# Patient Record
Sex: Female | Born: 1960 | Race: White | Hispanic: No | Marital: Married | Smoking: Never smoker
Health system: Southern US, Community
[De-identification: ages and names within clinical notes are randomized; demographics above are authoritative.]

## PROBLEM LIST (undated history)

## (undated) DIAGNOSIS — I509 Heart failure, unspecified: Secondary | ICD-10-CM

## (undated) DIAGNOSIS — J449 Chronic obstructive pulmonary disease, unspecified: Secondary | ICD-10-CM

## (undated) DIAGNOSIS — R609 Edema, unspecified: Secondary | ICD-10-CM

## (undated) DIAGNOSIS — J45909 Unspecified asthma, uncomplicated: Secondary | ICD-10-CM

## (undated) HISTORY — PX: TYMPANOSTOMY TUBE PLACEMENT: SHX32

## (undated) HISTORY — PX: CARPAL TUNNEL RELEASE: SHX101

## (undated) HISTORY — PX: FOOT SURGERY: SHX648

## (undated) HISTORY — DX: Heart failure, unspecified: I50.9

## (undated) HISTORY — DX: Edema, unspecified: R60.9

## (undated) HISTORY — DX: Chronic obstructive pulmonary disease, unspecified: J44.9

## (undated) HISTORY — PX: SHOULDER SURGERY: SHX246

## (undated) HISTORY — DX: Unspecified asthma, uncomplicated: J45.909

---

## 2007-09-20 ENCOUNTER — Ambulatory Visit: Payer: Self-pay

## 2010-10-02 ENCOUNTER — Emergency Department: Payer: Self-pay | Admitting: *Deleted

## 2011-06-30 DIAGNOSIS — M25559 Pain in unspecified hip: Secondary | ICD-10-CM | POA: Insufficient documentation

## 2013-03-20 IMAGING — CR DG ELBOW COMPLETE 3+V*L*
1 series · 4 of 4 positions shown · non-contrast
Comparison: none

REASON FOR EXAM: injury
COMMENTS:   May transport without cardiac monitor

PROCEDURE:     DXR - DXR ELBOW LT COMP W/OBLIQUES  - October 02, 2010  [DATE]
RESULT:     Comparison:  None

[Series 1: view not recorded · 0.17mm/px · 4 of 4 slices shown]
[im 1/4]
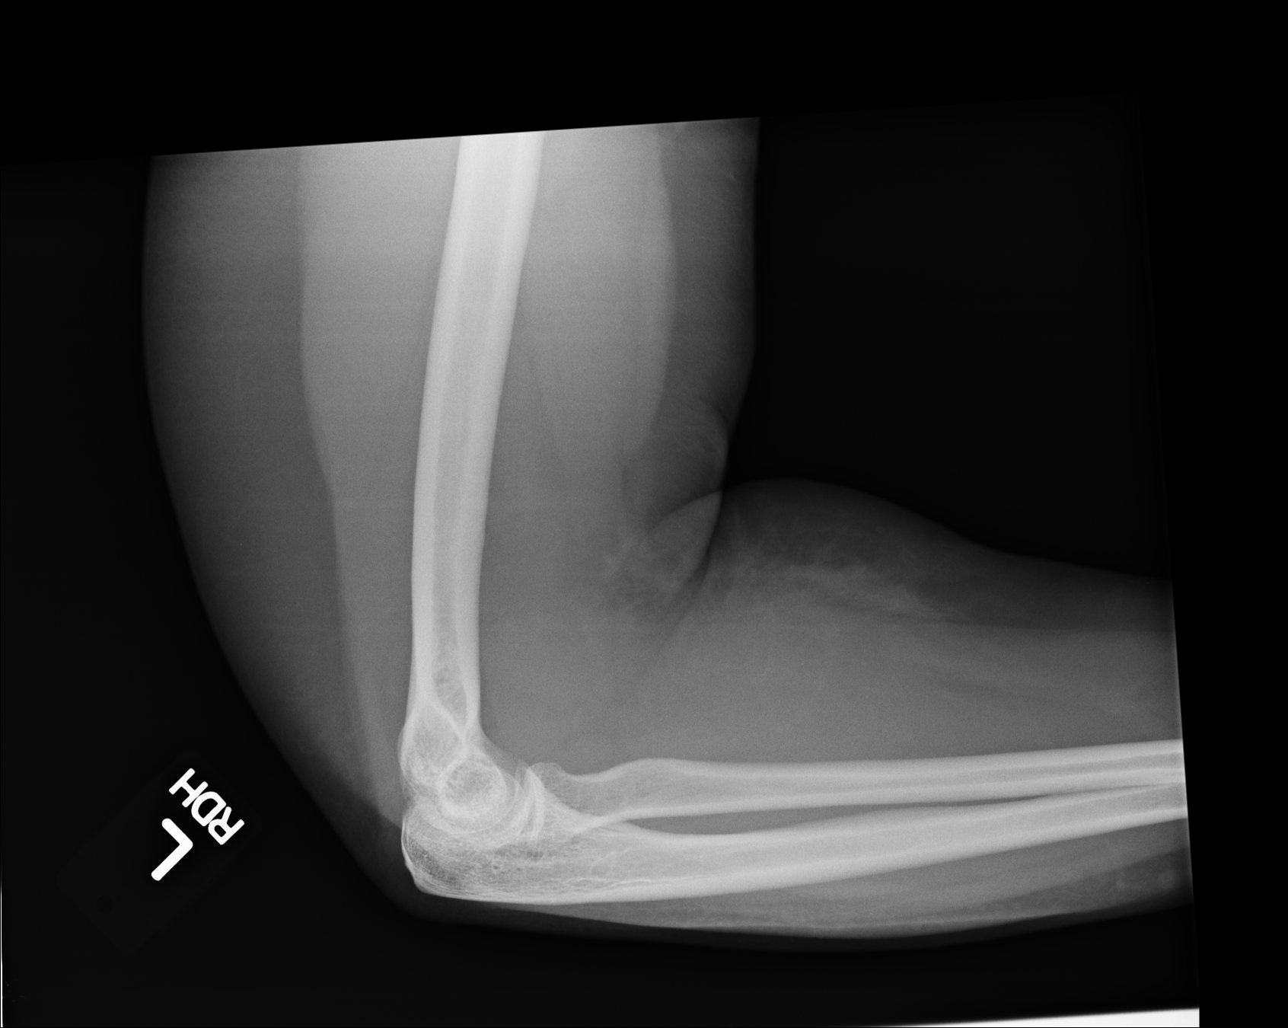
[im 2/4]
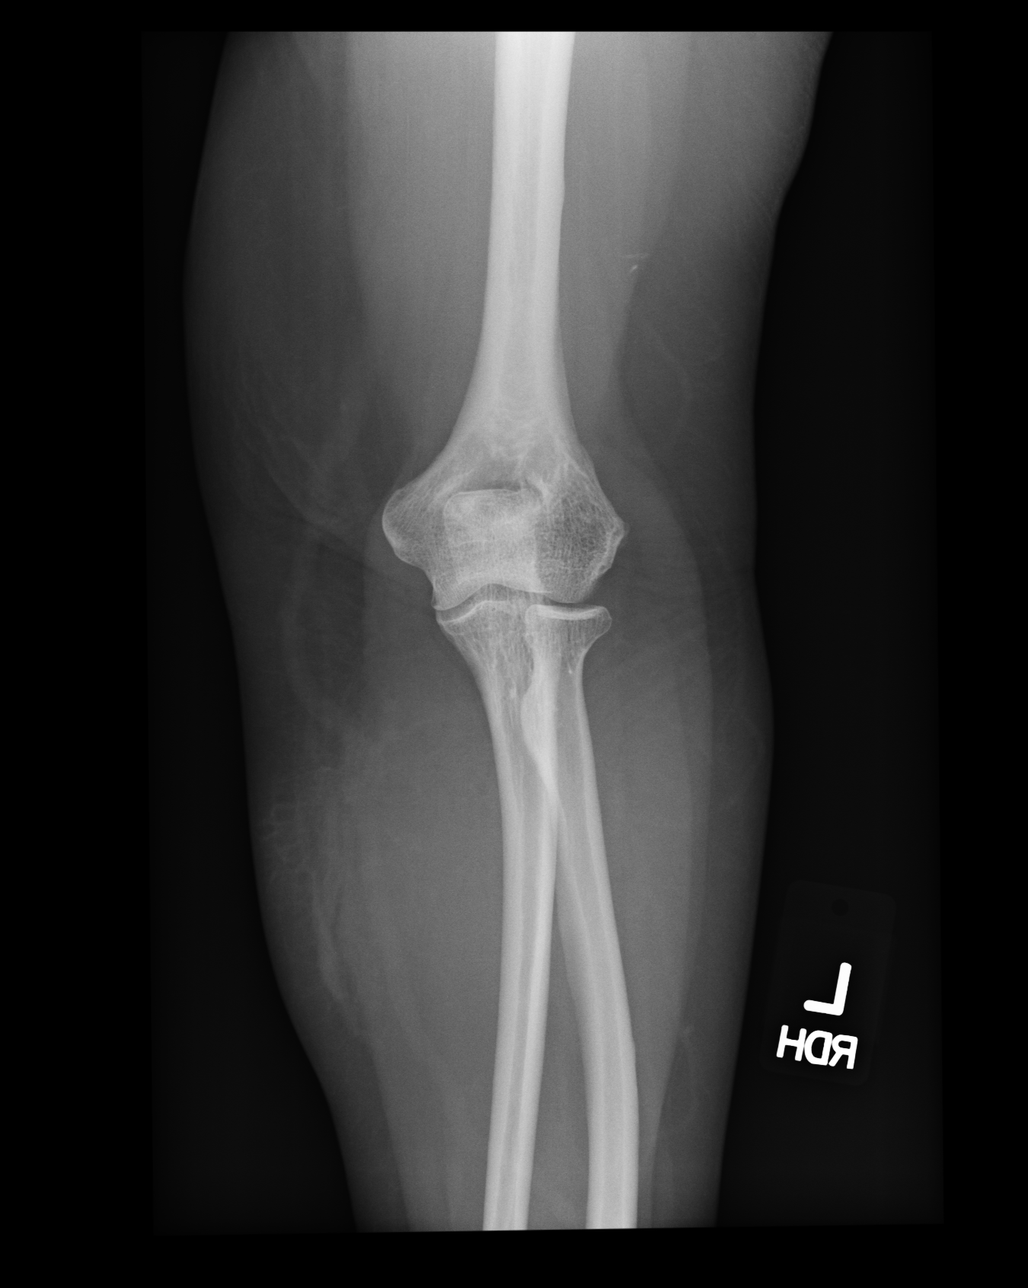
[im 3/4]
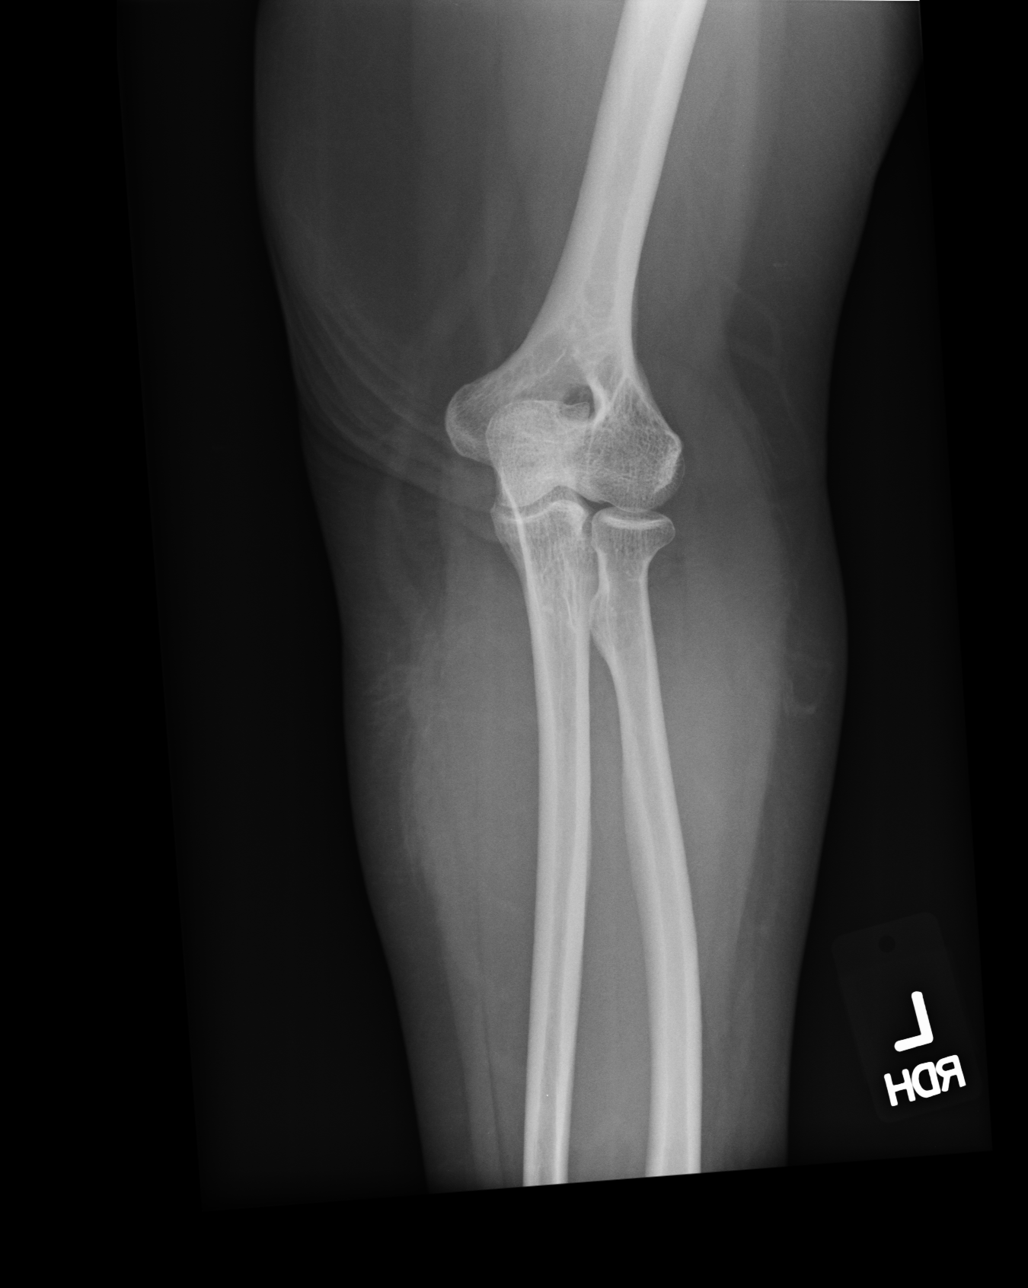
[im 4/4]
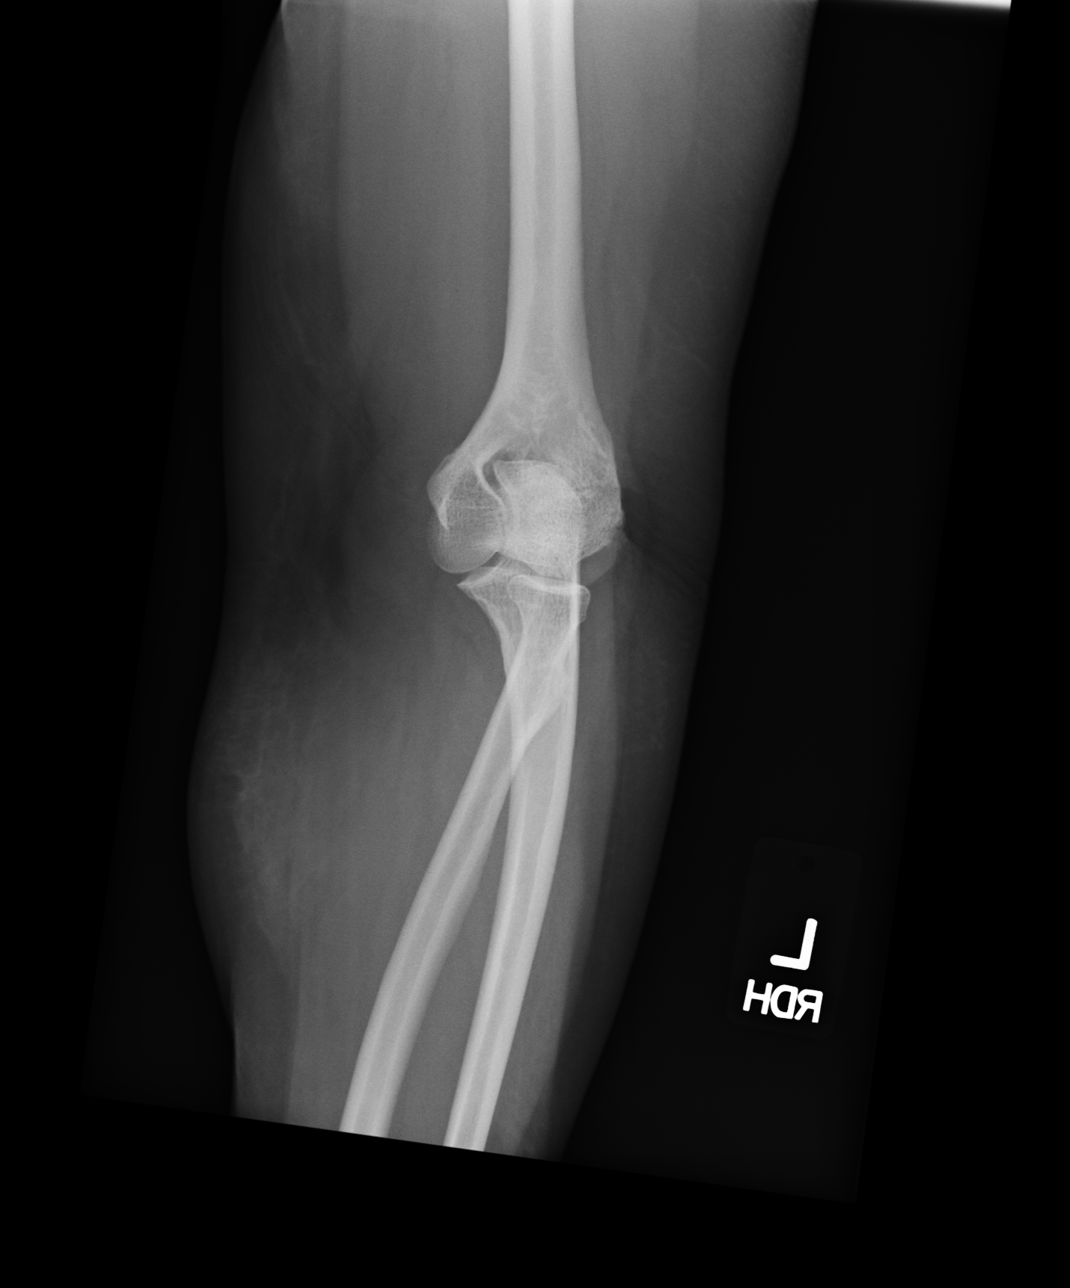

[4 of 4 positions shown; findings below may reference images not displayed]

FINDINGS: 4 views of the left elbow demonstrates no fracture or dislocation. There is
no significant joint effusion. The soft tissues are normal.
IMPRESSION: No acute osseous injury of the left elbow.

## 2013-07-28 LAB — HM MAMMOGRAPHY: HM Mammogram: NORMAL

## 2013-07-28 LAB — HM PAP SMEAR

## 2013-09-24 ENCOUNTER — Telehealth: Payer: Self-pay

## 2013-09-24 NOTE — Telephone Encounter (Signed)
Called spoke with Tiffany from [facility]. 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 A fax will be coming over with 2 questions they need answered if pt can be seen by us. Other doctors have been in her care are Dr. Patrice ParadiseSharma Rajan, Dr. Vista Minkobert Ross, Dr. Bethel BornBardless. [facility] has 15 pages of medcial records on pt. Please advise MW thanks

## 2013-09-24 NOTE — Telephone Encounter (Signed)
lmomtcb x1 for tiffany with [facility].

## 2013-09-24 NOTE — Telephone Encounter (Signed)
I don't see where I've seen this pt and do not agree to review paperwork first - I do see self referrals

## 2013-09-25 NOTE — Telephone Encounter (Signed)
lmomtcb x2 for [employer] Soupiffany

## 2013-09-25 NOTE — Telephone Encounter (Addendum)
Spoke with Tiffany from [facility] Calling back to try and figure out if we are able to see the patient--pt would be new to our office(Consult appt) Elmarie Shileyiffany states that if we agree to the see the patient then a Medical Reimbursement Form would be faxed over for the Dr Sherene SiresWert to sign. This form consists of an agreement that ONLY $450.00 will be reimbursed to our office for any and all services provided by our office the day of the appointment. Also, states that they will need a detailed breakdown of the costs expected for the upcoming appt in order to move forward. I advised Tiffany that there is no way to be 100% sure of what the appt will cost before the patient is seen. Tiffany stated that before we could schedule the patient, she would have to have this agreement signed and returned--states that ONLY $450 will be reimbursed and anything thereafter will not be reimbursed to our office.  Per Tiffany, if agreed, forms will be faxed to our office for Dr Sherene SiresWert to review prior to OV. Tiffany requesting to know if this can be agreed upon and if the patient can be scheduled.  Please advise Dr Sherene SiresWert if you are willing to see the patient with these terms. Thanks.

## 2013-09-25 NOTE — Telephone Encounter (Signed)
No, please decline

## 2013-09-25 NOTE — Telephone Encounter (Signed)
Called, spoke with Tiffany with [facility].  She is now aware MW declines.  She verbalized understanding.

## 2013-10-09 ENCOUNTER — Encounter: Payer: Self-pay | Admitting: Cardiovascular Disease

## 2013-10-09 ENCOUNTER — Ambulatory Visit (INDEPENDENT_AMBULATORY_CARE_PROVIDER_SITE_OTHER): Payer: BC Managed Care – PPO | Admitting: Cardiovascular Disease

## 2013-10-09 VITALS — BP 128/70 | HR 78 | Ht 60.0 in | Wt 225.0 lb

## 2013-10-09 DIAGNOSIS — I509 Heart failure, unspecified: Secondary | ICD-10-CM

## 2013-10-09 DIAGNOSIS — I5032 Chronic diastolic (congestive) heart failure: Secondary | ICD-10-CM | POA: Insufficient documentation

## 2013-10-09 DIAGNOSIS — R609 Edema, unspecified: Secondary | ICD-10-CM

## 2013-10-09 DIAGNOSIS — R0602 Shortness of breath: Secondary | ICD-10-CM | POA: Insufficient documentation

## 2013-10-09 DIAGNOSIS — R600 Localized edema: Secondary | ICD-10-CM

## 2013-10-09 DIAGNOSIS — E785 Hyperlipidemia, unspecified: Secondary | ICD-10-CM | POA: Insufficient documentation

## 2013-10-09 MED ORDER — FUROSEMIDE 20 MG PO TABS: 20.0000 mg | ORAL_TABLET | Freq: Two times a day (BID) | ORAL | Status: DC | PRN

## 2013-10-09 MED ORDER — POTASSIUM CHLORIDE ER 10 MEQ PO TBCR: 10.0000 meq | EXTENDED_RELEASE_TABLET | Freq: Two times a day (BID) | ORAL | Status: DC | PRN

## 2013-10-09 NOTE — Assessment & Plan Note (Signed)
We will draw her lipids today. She reports running high recently. Given her very strong family history, we would be aggressive with her cholesterol

## 2013-10-09 NOTE — Assessment & Plan Note (Signed)
Etiology of her shortness of breath is unclear. Unable to exclude diastolic CHF. We have offered her Lasix to take as needed with potassium. She'll take this when her leg swelling gets severe. Echocardiogram has been ordered to evaluate her cardiac function, right heart pressures.  Unable to exclude deconditioning and morbid obesity as a contributor to her symptoms. Recommended regular walking program, weight loss

## 2013-10-09 NOTE — Assessment & Plan Note (Signed)
We have encouraged continued exercise, careful diet management in an effort to lose weight. 

## 2013-10-09 NOTE — Assessment & Plan Note (Signed)
Recommended she take Lasix for weight gain, try to avoid excessive fluid intake and restrict her salt
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
## 2013-10-09 NOTE — Patient Instructions (Addendum)
You are doing well.  Please take lasix as needed for significant leg swelling Take with potassium  We will check your labs today  We will order an echocardiogram for murmur, leg swelling, shortness of breath  Please call us if you have new issues that need to be addressed before your next appt.

## 2013-10-09 NOTE — Assessment & Plan Note (Signed)
Likely has  chronic diastolic CHF also with venous insufficiency exacerbated by her weight. We'll start Lasix when necessary for episodes of severe leg edema, echo pending.

## 2013-10-10 LAB — LIPID PANEL
CHOL/HDL RATIO: 4.1 ratio (ref 0.0–4.4)
Cholesterol, Total: 211 mg/dL — ABNORMAL HIGH (ref 100–199)
HDL: 51 mg/dL (ref >39)
LDL CALC: 139 mg/dL — AB (ref 0–99)
Triglycerides: 106 mg/dL (ref 0–149)
VLDL Cholesterol Cal: 21 mg/dL (ref 5–40)

## 2013-10-15 ENCOUNTER — Other Ambulatory Visit (INDEPENDENT_AMBULATORY_CARE_PROVIDER_SITE_OTHER): Payer: BC Managed Care – PPO

## 2013-10-15 ENCOUNTER — Other Ambulatory Visit: Payer: Self-pay

## 2013-10-15 DIAGNOSIS — E785 Hyperlipidemia, unspecified: Secondary | ICD-10-CM

## 2013-10-15 DIAGNOSIS — R0602 Shortness of breath: Secondary | ICD-10-CM

## 2013-10-15 DIAGNOSIS — R609 Edema, unspecified: Secondary | ICD-10-CM

## 2013-10-15 DIAGNOSIS — R600 Localized edema: Secondary | ICD-10-CM

## 2013-10-15 MED ORDER — LOVASTATIN 20 MG PO TABS: 20.0000 mg | ORAL_TABLET | Freq: Every day | ORAL | Status: DC

## 2013-11-06 ENCOUNTER — Telehealth: Payer: Self-pay | Admitting: *Deleted

## 2013-11-06 NOTE — Telephone Encounter (Signed)
Reviewed potassium and lasix instructions with patient  Patient verbalized understanding

## 2013-11-06 NOTE — Telephone Encounter (Signed)
Patient called and has some questions regarding postassium.

## 2013-11-28 ENCOUNTER — Ambulatory Visit (INDEPENDENT_AMBULATORY_CARE_PROVIDER_SITE_OTHER): Payer: BC Managed Care – PPO | Admitting: Internal Medicine

## 2013-11-28 ENCOUNTER — Encounter: Payer: Self-pay | Admitting: Internal Medicine

## 2013-11-28 VITALS — BP 120/70 | HR 75 | Temp 98.2°F | Ht 60.0 in | Wt 223.5 lb

## 2013-11-28 DIAGNOSIS — E785 Hyperlipidemia, unspecified: Secondary | ICD-10-CM

## 2013-11-28 DIAGNOSIS — R0683 Snoring: Secondary | ICD-10-CM

## 2013-11-28 DIAGNOSIS — I5032 Chronic diastolic (congestive) heart failure: Secondary | ICD-10-CM

## 2013-11-28 DIAGNOSIS — Z1211 Encounter for screening for malignant neoplasm of colon: Secondary | ICD-10-CM

## 2013-11-28 DIAGNOSIS — I509 Heart failure, unspecified: Secondary | ICD-10-CM

## 2013-11-28 DIAGNOSIS — J45909 Unspecified asthma, uncomplicated: Secondary | ICD-10-CM

## 2013-11-28 DIAGNOSIS — J4520 Mild intermittent asthma, uncomplicated: Secondary | ICD-10-CM

## 2013-11-28 DIAGNOSIS — R0989 Other specified symptoms and signs involving the circulatory and respiratory systems: Secondary | ICD-10-CM

## 2013-11-28 DIAGNOSIS — R0609 Other forms of dyspnea: Secondary | ICD-10-CM

## 2013-11-28 LAB — MICROALBUMIN / CREATININE URINE RATIO
Creatinine,U: 159.9 mg/dL
MICROALB UR: 0.2 mg/dL (ref 0.0–1.9)
Microalb Creat Ratio: 0.1 mg/g (ref 0.0–30.0)

## 2013-11-28 LAB — COMPREHENSIVE METABOLIC PANEL
ALBUMIN: 4 g/dL (ref 3.5–5.2)
ALK PHOS: 84 U/L (ref 39–117)
ALT: 17 U/L (ref 0–35)
AST: 18 U/L (ref 0–37)
BILIRUBIN TOTAL: 1 mg/dL (ref 0.2–1.2)
BUN: 15 mg/dL (ref 6–23)
CO2: 27 mEq/L (ref 19–32)
Calcium: 9.1 mg/dL (ref 8.4–10.5)
Chloride: 104 mEq/L (ref 96–112)
Creatinine, Ser: 1 mg/dL (ref 0.4–1.2)
GFR: 64.83 mL/min (ref >60.00)
GLUCOSE: 105 mg/dL — AB (ref 70–99)
POTASSIUM: 4.4 meq/L (ref 3.5–5.1)
SODIUM: 138 meq/L (ref 135–145)
TOTAL PROTEIN: 6.7 g/dL (ref 6.0–8.3)

## 2013-11-28 LAB — HEMOGLOBIN A1C: HEMOGLOBIN A1C: 5.2 % (ref 4.6–6.5)

## 2013-11-28 LAB — LIPID PANEL
CHOL/HDL RATIO: 3
Cholesterol: 157 mg/dL (ref 0–200)
HDL: 48.2 mg/dL (ref >39.00)
LDL Cholesterol: 94 mg/dL (ref 0–99)
NONHDL: 108.8
Triglycerides: 76 mg/dL (ref 0.0–149.0)
VLDL: 15.2 mg/dL (ref 0.0–40.0)

## 2013-11-28 LAB — TSH: TSH: 2.05 u[IU]/mL (ref 0.35–4.50)

## 2013-11-28 NOTE — Patient Instructions (Signed)
Labs today.  We will set up a sleep study and colonoscopy.  Follow up in 4 weeks.

## 2013-11-28 NOTE — Progress Notes (Signed)
Subjective:    Patient ID: Brianna Nunez    DOB: 1960/04/02, 53 y.o.   MRN: [id number]  HPI 53YO female presents to establish care.  Seen by pulmonary specialist yesterday. PFTs performed. Reports that asthma generally well controlled. Flares with exposure to perfumes.  UTD on PAP which she reports was normal in 07/2013. Mammogram also reportedly normal.  Notes snoring at night for several years. Occasional gasping for breath. Has never had a sleep study.  Would like to lose weight. Not following any diet or exercise program.  Review of Systems  Constitutional: Negative for fever, chills, appetite change, fatigue and unexpected weight change.  Eyes: Negative for visual disturbance.  Respiratory: Negative for cough, shortness of breath and wheezing.   Cardiovascular: Negative for chest pain and leg swelling.  Gastrointestinal: Negative for nausea, vomiting, abdominal pain, diarrhea and constipation.  Musculoskeletal: Positive for arthralgias and myalgias.  Skin: Negative for color change and rash.  Hematological: Negative for adenopathy. Does not bruise/bleed easily.  Psychiatric/Behavioral: Positive for sleep disturbance (snoring). Negative for dysphoric mood. The patient is not nervous/anxious.        Objective:    BP 120/70  Pulse 75  Temp(Src) 98.2 F (36.8 C) (Oral)  Ht 5' (1.524 m)  Wt 223 lb 8 oz (101.379 kg)  BMI 43.65 kg/m2  SpO2 97% Physical Exam  Constitutional: She is oriented to person, place, and time. She appears well-developed and well-nourished. No distress.  HENT:  Head: Normocephalic and atraumatic.  Right Ear: External ear normal.  Left Ear: External ear normal.  Nose: Nose normal.  Mouth/Throat: Oropharynx is clear and moist. No oropharyngeal exudate.  Eyes: Conjunctivae and EOM are normal. Pupils are equal, round, and reactive to light. Right eye exhibits no discharge. Left eye exhibits no discharge. No scleral icterus.  Neck: Normal  range of motion. Neck supple. No tracheal deviation present. No thyromegaly present.  Cardiovascular: Normal rate, regular rhythm, normal heart sounds and intact distal pulses.  Exam reveals no gallop and no friction rub.   No murmur heard. Pulmonary/Chest: Effort normal and breath sounds normal. No accessory muscle usage. Not tachypneic. No respiratory distress. She has no decreased breath sounds. She has no wheezes. She has no rhonchi. She has no rales. She exhibits no tenderness.  Abdominal: Soft. Bowel sounds are normal. She exhibits no distension and no mass. There is no tenderness. There is no rebound and no guarding.  Musculoskeletal: Normal range of motion. She exhibits no edema and no tenderness.  Lymphadenopathy:    She has no cervical adenopathy.  Neurological: She is alert and oriented to person, place, and time. No cranial nerve deficit. She exhibits normal muscle tone. Coordination normal.  Skin: Skin is warm and dry. No rash noted. She is not diaphoretic. No erythema. No pallor.  Psychiatric: She has a normal mood and affect. Her behavior is normal. Judgment and thought content normal.          Assessment & Plan:   Problem List Items Addressed This Visit     Unprioritized   Asthma, mild intermittent, well-controlled     Will request recent notes from pulmonologist.     Chronic diastolic CHF (congestive heart failure) - Primary     Reviewed notes from Dr. Mariah Milling. Symptomatically doing well. Appears euvolemic. Will check renal function with labs. Continue current medications.    Relevant Orders      Comprehensive metabolic panel      Microalbumin / creatinine urine ratio  Hyperlipidemia     Will recheck lipids and LFTs with labs today. Continue Lovastatin.    Relevant Orders      Lipid panel   Morbid obesity      Wt Readings from Last 3 Encounters:  11/28/13 223 lb 8 oz (101.379 kg)  10/09/13 225 lb (102.059 kg)   Body mass index is 43.65 kg/(m^2). Encouraged  healthy diet and exercise for weight loss. Will set up nutrition referral.    Relevant Orders      Hemoglobin A1c      TSH      Amb ref to Medical Nutrition Therapy-MNT   Screening for colon cancer   Relevant Orders      Ambulatory referral to Gastroenterology   Snoring     Given obesity and snoring, high risk OSA. Will set up sleep study.    Relevant Orders      Ambulatory referral to Sleep Studies       Return in about 4 weeks (around 12/26/2013).

## 2013-11-28 NOTE — Assessment & Plan Note (Signed)
Wt Readings from Last 3 Encounters:  11/28/13 223 lb 8 oz (101.379 kg)  10/09/13 225 lb (102.059 kg)   Body mass index is 43.65 kg/(m^2). Encouraged healthy diet and exercise for weight loss. Will set up nutrition referral.

## 2013-11-28 NOTE — Assessment & Plan Note (Signed)
Will request recent notes from pulmonologist.

## 2013-11-28 NOTE — Assessment & Plan Note (Signed)
Given obesity and snoring, high risk OSA. Will set up sleep study.

## 2013-11-28 NOTE — Progress Notes (Signed)
Pre visit review using our clinic review tool, if applicable. No additional management support is needed unless otherwise documented below in the visit note. 

## 2013-11-28 NOTE — Assessment & Plan Note (Signed)
Reviewed notes from Dr. Mariah Milling. Symptomatically doing well. Appears euvolemic. Will check renal function with labs. Continue current medications.

## 2013-11-28 NOTE — Assessment & Plan Note (Signed)
Will recheck lipids and LFTs with labs today. Continue Lovastatin.

## 2013-11-29 ENCOUNTER — Telehealth: Payer: Self-pay

## 2013-11-29 ENCOUNTER — Encounter: Payer: Self-pay | Admitting: *Deleted

## 2013-11-29 NOTE — Telephone Encounter (Signed)
Pt states she went to her PCP, told her the u/s that was done here said she had CHF. Please call.

## 2013-11-29 NOTE — Telephone Encounter (Signed)
Spoke w/ pt.  She is confused, as she states that she was not aware that she had the diagnosis of CHF.  Had lengthy discussion w/ pt regarding her last ov, her diagnoses, her diet, exercise habits and ways to help.  She is very appreciative and asks that I mail out info on her diagnosis.  Advised pt that I am mailing info and asked her to call back w/ any questions.

## 2013-12-26 ENCOUNTER — Ambulatory Visit (INDEPENDENT_AMBULATORY_CARE_PROVIDER_SITE_OTHER): Payer: BC Managed Care – PPO | Admitting: Internal Medicine

## 2013-12-26 ENCOUNTER — Encounter: Payer: Self-pay | Admitting: Internal Medicine

## 2013-12-26 DIAGNOSIS — R0683 Snoring: Secondary | ICD-10-CM

## 2013-12-26 DIAGNOSIS — Z23 Encounter for immunization: Secondary | ICD-10-CM

## 2013-12-26 DIAGNOSIS — E785 Hyperlipidemia, unspecified: Secondary | ICD-10-CM

## 2013-12-26 DIAGNOSIS — E559 Vitamin D deficiency, unspecified: Secondary | ICD-10-CM

## 2013-12-26 DIAGNOSIS — R600 Localized edema: Secondary | ICD-10-CM

## 2013-12-26 MED ORDER — LOVASTATIN 20 MG PO TABS: 20.0000 mg | ORAL_TABLET | Freq: Every day | ORAL | Status: DC

## 2013-12-26 NOTE — Assessment & Plan Note (Signed)
Will recheck Vit D with labs today. 

## 2013-12-26 NOTE — Patient Instructions (Signed)
Continue current medications.  Follow up for sleep study when scheduled.

## 2013-12-26 NOTE — Assessment & Plan Note (Signed)
Edema improved with prn Furosemide. Will continue.

## 2013-12-26 NOTE — Progress Notes (Signed)
Subjective:    Patient ID: Brianna GentaBarbara A Benavidez, female    DOB: 07/18/1960, 53 y.o.   MRN: [id number]  HPI 53YO female presents for follow up.  Obesity - has lost 4lbs. Scheduled to meet with nutritionist. Has not yet started any specific program of exercise or diet.  Last A1c was <6%. Trying to limit sugared soda intake.  HL - Recent LDL was 94. Compliant with medication.  Snoring - Sleep study is pending.  Review of Systems  Constitutional: Negative for fever, chills, appetite change, fatigue and unexpected weight change.  Eyes: Negative for visual disturbance.  Respiratory: Positive for shortness of breath (with exertion).   Cardiovascular: Negative for chest pain and leg swelling.  Gastrointestinal: Negative for vomiting, abdominal pain, diarrhea and constipation.  Skin: Negative for color change and rash.  Hematological: Negative for adenopathy. Does not bruise/bleed easily.  Psychiatric/Behavioral: Negative for dysphoric mood. The patient is not nervous/anxious.        Objective:    BP 108/60  Pulse 82  Temp(Src) 98.3 F (36.8 C) (Oral)  Ht 5' (1.524 m)  Wt 219 lb 8 oz (99.565 kg)  BMI 42.87 kg/m2  SpO2 97% Physical Exam  Constitutional: She is oriented to person, place, and time. She appears well-developed and well-nourished. No distress.  HENT:  Head: Normocephalic and atraumatic.  Right Ear: External ear normal.  Left Ear: External ear normal.  Nose: Nose normal.  Mouth/Throat: Oropharynx is clear and moist. No oropharyngeal exudate.  Eyes: Conjunctivae are normal. Pupils are equal, round, and reactive to light. Right eye exhibits no discharge. Left eye exhibits no discharge. No scleral icterus.  Neck: Normal range of motion. Neck supple. No tracheal deviation present. No thyromegaly present.  Cardiovascular: Normal rate, regular rhythm, normal heart sounds and intact distal pulses.  Exam reveals no gallop and no friction rub.   No murmur  heard. Pulmonary/Chest: Effort normal and breath sounds normal. No accessory muscle usage. Not tachypneic. No respiratory distress. She has no decreased breath sounds. She has no wheezes. She has no rhonchi. She has no rales. She exhibits no tenderness.  Musculoskeletal: Normal range of motion. She exhibits no edema and no tenderness.  Lymphadenopathy:    She has no cervical adenopathy.  Neurological: She is alert and oriented to person, place, and time. No cranial nerve deficit. She exhibits normal muscle tone. Coordination normal.  Skin: Skin is warm and dry. No rash noted. She is not diaphoretic. No erythema. No pallor.  Psychiatric: She has a normal mood and affect. Her behavior is normal. Judgment and thought content normal.          Assessment & Plan:   Problem List Items Addressed This Visit     Unprioritized   Bilateral leg edema     Edema improved with prn Furosemide. Will continue.    Hyperlipidemia     Lipids markedly improved on Lovastatin. Will continue.    Relevant Medications      lovastatin (MEVACOR) tablet   Morbid obesity - Primary      Wt Readings from Last 3 Encounters:  12/26/13 219 lb 8 oz (99.565 kg)  11/28/13 223 lb 8 oz (101.379 kg)  10/09/13 225 lb (102.059 kg)   Body mass index is 42.87 kg/(m^2). Encouraged continued healthy diet and exercise with goal of weight loss. Will meet with nutritionist next month.    Snoring     Sleep study pending.    Vitamin D deficiency     Will recheck  Vit D with labs today.    Relevant Orders      Vitamin D (25 hydroxy)    Other Visit Diagnoses   Need for influenza vaccination        Relevant Orders       Flu Vaccine QUAD 36+ mos PF IM (Fluarix Quad PF) (Completed)        Return in about 3 months (around 03/28/2014) for Recheck of Diabetes.

## 2013-12-26 NOTE — Assessment & Plan Note (Signed)
Wt Readings from Last 3 Encounters:  12/26/13 219 lb 8 oz (99.565 kg)  11/28/13 223 lb 8 oz (101.379 kg)  10/09/13 225 lb (102.059 kg)   Body mass index is 42.87 kg/(m^2). Encouraged continued healthy diet and exercise with goal of weight loss. Will meet with nutritionist next month.

## 2013-12-26 NOTE — Progress Notes (Signed)
Pre visit review using our clinic review tool, if applicable. No additional management support is needed unless otherwise documented below in the visit note. 

## 2013-12-26 NOTE — Assessment & Plan Note (Signed)
Sleep study pending 

## 2013-12-26 NOTE — Assessment & Plan Note (Signed)
Lipids markedly improved on Lovastatin. Will continue.

## 2013-12-27 LAB — VITAMIN D 25 HYDROXY (VIT D DEFICIENCY, FRACTURES): VITD: 40.52 ng/mL (ref 30.00–100.00)

## 2013-12-30 ENCOUNTER — Encounter: Payer: Self-pay | Admitting: *Deleted

## 2013-12-31 ENCOUNTER — Ambulatory Visit: Payer: Self-pay | Admitting: Internal Medicine

## 2014-01-05 ENCOUNTER — Ambulatory Visit: Payer: Self-pay | Admitting: Internal Medicine

## 2014-01-13 ENCOUNTER — Telehealth: Payer: Self-pay | Admitting: Internal Medicine

## 2014-01-13 NOTE — Telephone Encounter (Signed)
Yes this has been completed

## 2014-01-13 NOTE — Telephone Encounter (Signed)
Sleep study showed sleep apnea, and we need to set up CPAP titration. Has this been set up?

## 2014-01-24 ENCOUNTER — Telehealth: Payer: Self-pay | Admitting: *Deleted

## 2014-01-24 NOTE — Telephone Encounter (Signed)
I have not seen the CPAP titration study results. We will need to request these.

## 2014-01-24 NOTE — Telephone Encounter (Signed)
Pt called wanting to know results of sleep study, Pt states that she received a call from [facility] about picking up her CPAP machine.  Pt wants to know what her results showed

## 2014-01-27 NOTE — Telephone Encounter (Signed)
OK. Will need CPAP settings from test to place order for CPAP at home.

## 2014-01-27 NOTE — Telephone Encounter (Signed)
Spoke with Brianna Nunez at [facility] and he states that pt's AHI = 20 .  His system is down right now but he will send complete copy of results to us once it gets back up.

## 2014-02-06 ENCOUNTER — Encounter: Payer: Self-pay | Admitting: Internal Medicine

## 2014-02-10 ENCOUNTER — Ambulatory Visit: Payer: Self-pay | Admitting: Gastroenterology

## 2014-03-03 LAB — HM COLONOSCOPY

## 2014-03-28 ENCOUNTER — Ambulatory Visit: Payer: BC Managed Care – PPO | Admitting: Internal Medicine

## 2014-04-01 ENCOUNTER — Encounter: Payer: Self-pay | Admitting: Internal Medicine

## 2014-04-03 ENCOUNTER — Ambulatory Visit (INDEPENDENT_AMBULATORY_CARE_PROVIDER_SITE_OTHER): Payer: BLUE CROSS/BLUE SHIELD | Admitting: Internal Medicine

## 2014-04-03 ENCOUNTER — Encounter: Payer: Self-pay | Admitting: Internal Medicine

## 2014-04-03 VITALS — BP 109/75 | HR 78 | Temp 97.9°F | Ht 60.0 in | Wt 219.5 lb

## 2014-04-03 DIAGNOSIS — R300 Dysuria: Secondary | ICD-10-CM

## 2014-04-03 DIAGNOSIS — G4733 Obstructive sleep apnea (adult) (pediatric): Secondary | ICD-10-CM

## 2014-04-03 LAB — POCT URINALYSIS DIPSTICK
Bilirubin, UA: NEGATIVE
GLUCOSE UA: NEGATIVE
KETONES UA: NEGATIVE
Nitrite, UA: NEGATIVE
PH UA: 5
Protein, UA: NEGATIVE
RBC UA: NEGATIVE
Spec Grav, UA: 1.02
Urobilinogen, UA: 0.2

## 2014-04-03 NOTE — Assessment & Plan Note (Signed)
Encouraged compliance with healthy diet and exercise.  Wt Readings from Last 3 Encounters:  04/03/14 219 lb 8 oz (99.565 kg)  12/26/13 219 lb 8 oz (99.565 kg)  11/28/13 223 lb 8 oz (101.379 kg)

## 2014-04-03 NOTE — Assessment & Plan Note (Signed)
Some intolerance to CPAP mask. Will continue to adjust for comfort and tolerance.

## 2014-04-03 NOTE — Patient Instructions (Signed)
Follow up in 6 months 

## 2014-04-03 NOTE — Addendum Note (Signed)
Addended by: Montine CircleMALDONADO, Taronda Comacho D on: 04/03/2014 02:11 PM   Modules accepted: Orders

## 2014-04-03 NOTE — Assessment & Plan Note (Addendum)
Recent mild dysuria. UA pos for leuk only. Will send for culture. Encouraged increased fluid intake. Monitor symptoms for now and hold on antibiotic.

## 2014-04-03 NOTE — Progress Notes (Signed)
Subjective:    Patient ID: Brianna GentaBarbara A Nunez, female    DOB: 1960-10-20, 54 y.o.   MRN: [id number]  HPI  54YO female presents for follow up.  Having trouble using CPAP after recent episode of bronchitis. [facility] Uses nasal mask, but changed to full mask. Tolerating this better.   Recently notes some burning with urination. No urinary frequency or urgency. No hematuria. No flank pain. Not taking anything for this.  Wt Readings from Last 3 Encounters:  04/03/14 219 lb 8 oz (99.565 kg)  12/26/13 219 lb 8 oz (99.565 kg)  11/28/13 223 lb 8 oz (101.379 kg)     Past medical, surgical, family and social history per today's encounter.  Review of Systems  Constitutional: Negative for fever, chills, appetite change, fatigue and unexpected weight change.  Eyes: Negative for visual disturbance.  Respiratory: Negative for shortness of breath.   Cardiovascular: Negative for chest pain and leg swelling.  Gastrointestinal: Negative for abdominal pain, diarrhea and constipation.  Genitourinary: Positive for dysuria. Negative for urgency, frequency, hematuria and flank pain.  Skin: Negative for color change and rash.  Hematological: Negative for adenopathy. Does not bruise/bleed easily.  Psychiatric/Behavioral: Positive for sleep disturbance. Negative for dysphoric mood. The patient is not nervous/anxious.        Objective:    BP 109/75 mmHg  Pulse 78  Temp(Src) 97.9 F (36.6 C) (Oral)  Ht 5' (1.524 m)  Wt 219 lb 8 oz (99.565 kg)  BMI 42.87 kg/m2  SpO2 93% Physical Exam  Constitutional: She is oriented to person, place, and time. She appears well-developed and well-nourished. No distress.  HENT:  Head: Normocephalic and atraumatic.  Right Ear: External ear normal.  Left Ear: External ear normal.  Nose: Nose normal.  Mouth/Throat: Oropharynx is clear and moist. No oropharyngeal exudate.  Eyes: Conjunctivae are normal. Pupils are equal, round, and  reactive to light. Right eye exhibits no discharge. Left eye exhibits no discharge. No scleral icterus.  Neck: Normal range of motion. Neck supple. No tracheal deviation present. No thyromegaly present.  Cardiovascular: Normal rate, regular rhythm, normal heart sounds and intact distal pulses.  Exam reveals no gallop and no friction rub.   No murmur heard. Pulmonary/Chest: Effort normal and breath sounds normal. No accessory muscle usage. No tachypnea. No respiratory distress. She has no decreased breath sounds. She has no wheezes. She has no rhonchi. She has no rales. She exhibits no tenderness.  Abdominal: There is no tenderness (no CVA tenderness).  Musculoskeletal: Normal range of motion. She exhibits no edema or tenderness.  Lymphadenopathy:    She has no cervical adenopathy.  Neurological: She is alert and oriented to person, place, and time. No cranial nerve deficit. She exhibits normal muscle tone. Coordination normal.  Skin: Skin is warm and dry. No rash noted. She is not diaphoretic. No erythema. No pallor.  Psychiatric: She has a normal mood and affect. Her behavior is normal. Judgment and thought content normal.          Assessment & Plan:  Over 25min of which >50% spent in face-to-face contact with patient discussing plan of care  Problem List Items Addressed This Visit      Unprioritized   Dysuria    Recent mild dysuria. UA pos for leuk only. Will send for culture. Encouraged increased fluid intake. Monitor symptoms for now and hold on antibiotic.      Relevant Orders   POCT Urinalysis Dipstick (Completed)   Morbid obesity  Encouraged compliance with healthy diet and exercise.  Wt Readings from Last 3 Encounters:  04/03/14 219 lb 8 oz (99.565 kg)  12/26/13 219 lb 8 oz (99.565 kg)  11/28/13 223 lb 8 oz (101.379 kg)         OSA (obstructive sleep apnea) - Primary    Some intolerance to CPAP mask. Will continue to adjust for comfort and tolerance.            Return in about 6 months (around 10/02/2014) for Recheck.

## 2014-04-03 NOTE — Progress Notes (Signed)
Pre visit review using our clinic review tool, if applicable. No additional management support is needed unless otherwise documented below in the visit note. 

## 2014-04-04 LAB — URINE CULTURE: Colony Count: 15000

## 2014-05-07 ENCOUNTER — Telehealth: Payer: Self-pay | Admitting: Internal Medicine

## 2014-05-07 NOTE — Telephone Encounter (Signed)
The patient is wanting her results from her urine culture.

## 2014-05-07 NOTE — Telephone Encounter (Signed)
Left message for pt to return my call.

## 2014-05-09 NOTE — Telephone Encounter (Signed)
Left message for pt to return my call.

## 2014-05-13 NOTE — Telephone Encounter (Signed)
Pt has not returned phone calls. Letter mailed to contact office if any further questions/concerns.

## 2014-10-02 ENCOUNTER — Ambulatory Visit: Payer: BLUE CROSS/BLUE SHIELD | Admitting: Internal Medicine

## 2015-05-14 ENCOUNTER — Telehealth: Payer: Self-pay | Admitting: Pediatrics

## 2015-05-14 NOTE — Telephone Encounter (Signed)
Received bill that she said should have been covered by [employer]. Pls call back on home phone.

## 2015-05-15 NOTE — Telephone Encounter (Signed)
Pt will call w/c to see why they are not paying

## 2015-06-02 NOTE — Telephone Encounter (Signed)
Received bill but Workers Comp was supposed to cover. [facility] and would like to speak to you.

## 2015-06-02 NOTE — Telephone Encounter (Signed)
LM TO CALL ME BACK TOMORROW

## 2015-06-05 NOTE — Telephone Encounter (Signed)
PT WILL CALL W/C AGAIN - THEY TOLD HER THE DX USED WAS NOT COVERED BUT I TOLD HER WE USED ASTHMA & ALLERGIC RHINITIS WHICH ARE SUPPOSED TO BE COVERED - SHE WILL MAKE PMTS IF THEY DO NOT PAY

## 2015-06-18 ENCOUNTER — Telehealth: Payer: Self-pay | Admitting: Pediatrics

## 2015-06-18 NOTE — Telephone Encounter (Signed)
She says that Workers Comp is refusing to pay but said that if we put the diagnosis code for alergic rhinitis on her paperwork they would pick up and pay it. If you have any questions please contact her at her home number. If you cant reach her there call her cell. Thanks!

## 2015-06-18 NOTE — Telephone Encounter (Signed)
PT HAS CALLED W/C - THEY WILL BE CALLING HER BACK & SHE WILL FIND OUT EXACTLY WHAT THEY NEED

## 2015-06-30 NOTE — Telephone Encounter (Signed)
Brianna Nunez, PT WOULD LIKE A RETURN CALL TO DISCUSS HER CONVERSATION WITH THE DEPARTMENT OF LABOR

## 2015-07-06 NOTE — Telephone Encounter (Signed)
CALLED W/[employer] - WILL TRY REFILING ONCE MORE

## 2015-07-24 ENCOUNTER — Telehealth: Payer: Self-pay | Admitting: *Deleted

## 2015-07-24 NOTE — Telephone Encounter (Signed)
Brianna Nunez,  PATIENT WOULD LIKE A RETURN CALL TODAY.  THANK YOU!

## 2015-07-27 NOTE — Telephone Encounter (Signed)
Have spoken to pt several times - we are refiling with ICD-10 allergic rhinitis only

## 2015-07-28 ENCOUNTER — Ambulatory Visit: Payer: Self-pay | Admitting: Pediatrics

## 2015-07-28 ENCOUNTER — Ambulatory Visit (INDEPENDENT_AMBULATORY_CARE_PROVIDER_SITE_OTHER): Admitting: Pediatrics

## 2015-07-28 ENCOUNTER — Encounter: Payer: Self-pay | Admitting: Pediatrics

## 2015-07-28 VITALS — BP 124/82 | HR 70 | Temp 98.7°F | Resp 20

## 2015-07-28 DIAGNOSIS — J4540 Moderate persistent asthma, uncomplicated: Secondary | ICD-10-CM

## 2015-07-28 DIAGNOSIS — J301 Allergic rhinitis due to pollen: Secondary | ICD-10-CM

## 2015-07-28 MED ORDER — ALBUTEROL SULFATE HFA 108 (90 BASE) MCG/ACT IN AERS: 2.0000 | INHALATION_SPRAY | RESPIRATORY_TRACT | Status: DC | PRN

## 2015-07-28 MED ORDER — BUDESONIDE-FORMOTEROL FUMARATE 160-4.5 MCG/ACT IN AERO: INHALATION_SPRAY | RESPIRATORY_TRACT | Status: DC

## 2015-07-28 NOTE — Patient Instructions (Signed)
Symbicort 160-2 puffs twice a day to prevent coughing or wheezing Pro-air 2 puffs every 4 hours if needed for coughing or wheezing Claritin 10 mg once a day if needed for runny nose Add prednisone 10 mg twice a day for 4 days 10 mg on the fifth day to bring your allergic symptoms under control Nasacort 1 spray per nostril twice a day if needed for stuffy nose

## 2015-07-28 NOTE — Progress Notes (Signed)
  [address] [place] [address] Dept: [phone]  FOLLOW UP NOTE  Patient ID: Brianna Nunez, female    DOB: Feb 27, 1961  Age: 55 y.o. MRN: [id number] Date of Office Visit: 07/28/2015  Assessment Chief Complaint: Asthma  HPI Brianna Nunez presents for follow-up of her asthma and allergic rhinitis. She has been having more coughing and nasal congestion over the past month. During the past year she has continued to need Symbicort 160-2 puffs every 12 hours and Pro-air 2 puffs every 4 hours if needed.. She developed asthma while working at [employer] where she was exposed to cleaning chemicals. 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 Sulfa Antibiotics Hives  . Sulfamethoxazole-Trimethoprim Hives  . Oseltamivir Rash    tamiflu  . Penicillins Rash    Physical Exam: BP 124/82 mmHg  Pulse 70  Temp(Src) 98.7 F (37.1 C) (Oral)  Resp 20   Physical Exam  Constitutional: She is oriented to person, place, and time. She appears well-developed and well-nourished.  HENT:  Eyes normal. Ears normal. Nose normal. Pharynx normal.  Neck: Neck supple.  Cardiovascular:  S1 and S2 normal no murmurs  Pulmonary/Chest:  Clear to percussion auscultation  Lymphadenopathy:    She has no cervical adenopathy.  Neurological: She is alert and oriented to person, place, and time.  Psychiatric: She has a normal mood and affect. Her behavior is normal. Judgment and thought content normal.  Vitals reviewed.   Diagnostics:  FVC 2.90 L FEV1 2.25 L. Predicted FVC 3.05 L predicted FEV1 2.38 L-the spirometry is in the normal range  Assessment and Plan: 1. Moderate persistent asthma, uncomplicated   2. Allergic rhinitis due to pollen     Meds ordered this encounter  Medications  . budesonide-formoterol (SYMBICORT) 160-4.5 MCG/ACT inhaler    Sig: TWO PUFFS TWICE A DAY TO PREVENT COUGH OR WHEEZE.  RINSE, GARGLE AND SPIT AFTER USE.    Dispense:  10.2 g    Refill:  3  . albuterol (PROAIR HFA) 108 (90 Base) MCG/ACT inhaler    Sig: Inhale 2 puffs into the lungs every 4 (four) hours as needed for wheezing or shortness of breath.    Dispense:  8 g    Refill:  1    Patient Instructions  Symbicort 160-2 puffs twice a day to prevent coughing or wheezing Pro-air 2 puffs every 4 hours if needed for coughing or wheezing Claritin 10 mg once a day if needed for runny nose Add prednisone 10 mg twice a day for 4 days 10 mg on the fifth day to bring your allergic symptoms under control Nasacort 1 spray per nostril twice a day if needed for stuffy nose    Return in about 1 year (around 07/27/2016).    Thank you for the opportunity to care for this patient.  Please do not hesitate to contact me with questions.  Tonette BihariJ. A. Sylar Voong, M.D.  [facility] [address] [place], [place] [address] [phone]

## 2015-07-29 ENCOUNTER — Encounter: Payer: Self-pay | Admitting: Pediatrics

## 2015-07-29 DIAGNOSIS — J4540 Moderate persistent asthma, uncomplicated: Secondary | ICD-10-CM | POA: Insufficient documentation

## 2015-07-29 DIAGNOSIS — J301 Allergic rhinitis due to pollen: Secondary | ICD-10-CM | POA: Insufficient documentation

## 2016-02-10 ENCOUNTER — Ambulatory Visit: Payer: Self-pay | Admitting: Ophthalmology

## 2016-02-12 ENCOUNTER — Telehealth: Payer: Self-pay | Admitting: *Deleted

## 2016-02-12 MED ORDER — BUDESONIDE-FORMOTEROL FUMARATE 160-4.5 MCG/ACT IN AERO: INHALATION_SPRAY | RESPIRATORY_TRACT | 4 refills | Status: DC

## 2016-02-12 MED ORDER — ALBUTEROL SULFATE HFA 108 (90 BASE) MCG/ACT IN AERS: 2.0000 | INHALATION_SPRAY | RESPIRATORY_TRACT | 2 refills | Status: DC | PRN

## 2016-02-12 NOTE — Telephone Encounter (Signed)
Patient calling needing refills on inhalers.

## 2016-03-22 ENCOUNTER — Telehealth: Payer: Self-pay | Admitting: Internal Medicine

## 2016-03-22 NOTE — Telephone Encounter (Signed)
Pt will come in to get some financial information. Thank you!

## 2016-08-02 ENCOUNTER — Encounter: Payer: Self-pay | Admitting: Pediatrics

## 2016-08-02 ENCOUNTER — Ambulatory Visit (INDEPENDENT_AMBULATORY_CARE_PROVIDER_SITE_OTHER): Admitting: Pediatrics

## 2016-08-02 VITALS — BP 138/86 | HR 78 | Temp 98.3°F | Resp 20 | Ht 61.0 in | Wt 224.0 lb

## 2016-08-02 DIAGNOSIS — K219 Gastro-esophageal reflux disease without esophagitis: Secondary | ICD-10-CM

## 2016-08-02 DIAGNOSIS — J301 Allergic rhinitis due to pollen: Secondary | ICD-10-CM

## 2016-08-02 DIAGNOSIS — J4540 Moderate persistent asthma, uncomplicated: Secondary | ICD-10-CM | POA: Diagnosis not present

## 2016-08-02 MED ORDER — ALBUTEROL SULFATE HFA 108 (90 BASE) MCG/ACT IN AERS: 2.0000 | INHALATION_SPRAY | RESPIRATORY_TRACT | 2 refills | Status: DC | PRN

## 2016-08-02 MED ORDER — OMEPRAZOLE 20 MG PO CPDR: 20.0000 mg | DELAYED_RELEASE_CAPSULE | Freq: Two times a day (BID) | ORAL | 5 refills | Status: DC

## 2016-08-02 MED ORDER — BUDESONIDE-FORMOTEROL FUMARATE 160-4.5 MCG/ACT IN AERO: 2.0000 | INHALATION_SPRAY | Freq: Two times a day (BID) | RESPIRATORY_TRACT | 5 refills | Status: DC

## 2016-08-02 NOTE — Progress Notes (Signed)
[address] [place] [address] Dept: [phone]  FOLLOW UP NOTE  Patient ID: Brianna Nunez, female    DOB: 1960/08/12  Age: 56 y.o. MRN: [id number] Date of Office Visit: 08/02/2016  Assessment  Chief Complaint: Asthma and Wheezing  HPI Brianna Nunez presents for follow-up of asthma and allergic rhinitis. She continues to have asthma when she is exposed to dust and strong odors, in particular chemical odors. She has frequent clearing of her throat and at times has heartburn.. She developed asthma while  working at the [employer] where she was exposed to cleaning chemicals. 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 Sulfa Antibiotics Hives  . Sulfamethoxazole-Trimethoprim Hives  . Oseltamivir Rash    tamiflu  . Penicillins Rash    Physical Exam: BP 138/86   Pulse 78   Temp 98.3 F (36.8 C) (Oral)   Resp 20   Ht 5\' 1"  (1.549 m)   Wt 224 lb (101.6 kg)   SpO2 93%   BMI 42.32 kg/m    Physical Exam  Constitutional: She is oriented to person, place, and time. She appears well-developed and well-nourished.  HENT:  Eyes normal. Ears normal. Nose normal. Pharynx normal.  Neck: Neck supple.  Cardiovascular:  S1 and S2 normal no murmurs  Pulmonary/Chest:  Clear to percussion and auscultation  Lymphadenopathy:    She has no cervical adenopathy.  Neurological: She is alert and oriented to person, place, and time.  Psychiatric: She has a normal mood and affect. Her behavior is normal. Judgment and thought content normal.  Vitals reviewed.   Diagnostics:  FVC 2.38 L FEV1 1.88 L. Predicted FVC 2.38 L predicted FEV1 1.88 L-the spirometry is in the normal range  Assessment and Plan: 1. Moderate persistent asthma without complication   2. Gastroesophageal reflux disease without esophagitis   3. Seasonal allergic rhinitis due to pollen     Meds ordered this encounter    Medications  . budesonide-formoterol (SYMBICORT) 160-4.5 MCG/ACT inhaler    Sig: Inhale 2 puffs into the lungs 2 (two) times daily.    Dispense:  10.2 g    Refill:  5  . albuterol (PROVENTIL HFA) 108 (90 Base) MCG/ACT inhaler    Sig: Inhale 2 puffs into the lungs every 4 (four) hours as needed for wheezing or shortness of breath.    Dispense:  1 Inhaler    Refill:  2  . omeprazole (PRILOSEC) 20 MG capsule    Sig: Take 1 capsule (20 mg total) by mouth 2 (two) times daily.    Dispense:  60 capsule    Refill:  5    Patient Instructions  Claritin 10 mg once a day for runny nose if needed Nasacort 1 spray per nostril twice a day if needed for stuffy nose Symbicort 160- 2 puffs every 12 hours for coughing or wheezing Proventil 2 puffs every 4 hours if needed for wheezing or coughing spells Omeprazole 20 mg once a day for acid reflux. You may use it twice a day Continue on your other medications Call me if you're not doing well on this treatment plan   Return in about 1 year (around 08/02/2017).    Thank you for the opportunity to care for this patient.  Please do not hesitate to contact me with questions.  Tonette BihariJ. A. Kenrick Pore, M.D.  [facility] [place] [address] [place],  [place] [address] [phone]

## 2016-08-02 NOTE — Patient Instructions (Signed)
Claritin 10 mg once a day for runny nose if needed Nasacort 1 spray per nostril twice a day if needed for stuffy nose Symbicort 160- 2 puffs every 12 hours for coughing or wheezing Proventil 2 puffs every 4 hours if needed for wheezing or coughing spells Omeprazole 20 mg once a day for acid reflux. You may use it twice a day Continue on your other medications Call me if you're not doing well on this treatment plan

## 2016-08-03 ENCOUNTER — Encounter: Payer: Self-pay | Admitting: Pediatrics

## 2016-12-06 ENCOUNTER — Telehealth: Payer: Self-pay | Admitting: Nurse Practitioner

## 2016-12-06 NOTE — Telephone Encounter (Signed)
Wanted to schedule dental appt. Called back and explained dental referrals

## 2017-03-16 ENCOUNTER — Ambulatory Visit: Payer: Self-pay

## 2017-03-29 ENCOUNTER — Ambulatory Visit: Payer: Self-pay | Admitting: Ophthalmology

## 2017-07-13 ENCOUNTER — Encounter: Payer: Self-pay | Admitting: Adult Health

## 2017-07-13 ENCOUNTER — Ambulatory Visit: Payer: Self-pay | Admitting: Adult Health

## 2017-07-13 VITALS — BP 108/62 | HR 65 | Temp 98.8°F | Ht 60.0 in | Wt 204.9 lb

## 2017-07-13 DIAGNOSIS — Z0000 Encounter for general adult medical examination without abnormal findings: Secondary | ICD-10-CM

## 2017-07-13 DIAGNOSIS — L853 Xerosis cutis: Secondary | ICD-10-CM

## 2017-07-13 DIAGNOSIS — E785 Hyperlipidemia, unspecified: Secondary | ICD-10-CM

## 2017-07-13 DIAGNOSIS — G4733 Obstructive sleep apnea (adult) (pediatric): Secondary | ICD-10-CM

## 2017-07-13 MED ORDER — HYDROCORTISONE 2.5 % EX OINT
TOPICAL_OINTMENT | Freq: Two times a day (BID) | CUTANEOUS | 1 refills | Status: AC
Start: 2017-07-13 — End: ?
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
## 2017-07-18 LAB — CBC WITH DIFFERENTIAL/PLATELET
BASOS ABS: 0 10*3/uL (ref 0.0–0.2)
Basos: 0 %
EOS (ABSOLUTE): 0.1 10*3/uL (ref 0.0–0.4)
EOS: 1 %
HEMATOCRIT: 41.4 % (ref 34.0–46.6)
HEMOGLOBIN: 14.6 g/dL (ref 11.1–15.9)
IMMATURE GRANS (ABS): 0 10*3/uL (ref 0.0–0.1)
IMMATURE GRANULOCYTES: 0 %
LYMPHS: 26 %
Lymphocytes Absolute: 2.1 10*3/uL (ref 0.7–3.1)
MCH: 31.7 pg (ref 26.6–33.0)
MCHC: 35.3 g/dL (ref 31.5–35.7)
MCV: 90 fL (ref 79–97)
MONOCYTES: 8 %
Monocytes Absolute: 0.7 10*3/uL (ref 0.1–0.9)
Neutrophils Absolute: 5.4 10*3/uL (ref 1.4–7.0)
Neutrophils: 65 %
Platelets: 170 10*3/uL (ref 150–379)
RBC: 4.6 x10E6/uL (ref 3.77–5.28)
RDW: 13.5 % (ref 12.3–15.4)
WBC: 8.3 10*3/uL (ref 3.4–10.8)

## 2017-07-18 LAB — COMPREHENSIVE METABOLIC PANEL
A/G RATIO: 1.8 (ref 1.2–2.2)
ALT: 14 IU/L (ref 0–32)
AST: 19 IU/L (ref 0–40)
Albumin: 4.5 g/dL (ref 3.5–5.5)
Alkaline Phosphatase: 94 IU/L (ref 39–117)
BUN/Creatinine Ratio: 15 (ref 9–23)
BUN: 16 mg/dL (ref 6–24)
Bilirubin Total: 0.9 mg/dL (ref 0.0–1.2)
CALCIUM: 9.5 mg/dL (ref 8.7–10.2)
CO2: 25 mmol/L (ref 20–29)
Chloride: 104 mmol/L (ref 96–106)
Creatinine, Ser: 1.05 mg/dL — ABNORMAL HIGH (ref 0.57–1.00)
GFR calc Af Amer: 68 mL/min/{1.73_m2} (ref >59)
GFR, EST NON AFRICAN AMERICAN: 59 mL/min/{1.73_m2} — AB (ref >59)
GLOBULIN, TOTAL: 2.5 g/dL (ref 1.5–4.5)
Glucose: 85 mg/dL (ref 65–99)
POTASSIUM: 4.2 mmol/L (ref 3.5–5.2)
Sodium: 144 mmol/L (ref 134–144)
Total Protein: 7 g/dL (ref 6.0–8.5)

## 2017-07-18 LAB — LIPID PANEL
CHOL/HDL RATIO: 3.6 ratio (ref 0.0–4.4)
CHOLESTEROL TOTAL: 174 mg/dL (ref 100–199)
HDL: 48 mg/dL (ref >39)
LDL CALC: 108 mg/dL — AB (ref 0–99)
TRIGLYCERIDES: 90 mg/dL (ref 0–149)
VLDL Cholesterol Cal: 18 mg/dL (ref 5–40)

## 2017-07-18 LAB — VITAMIN D 1,25 DIHYDROXY
VITAMIN D2 1, 25 (OH): 10 pg/mL
Vitamin D 1, 25 (OH)2 Total: 42 pg/mL
Vitamin D3 1, 25 (OH)2: 32 pg/mL

## 2017-07-18 LAB — HEMOGLOBIN A1C
Est. average glucose Bld gHb Est-mCnc: 100 mg/dL
Hgb A1c MFr Bld: 5.1 % (ref 4.8–5.6)

## 2017-07-18 LAB — PHOSPHORUS: PHOSPHORUS: 3.1 mg/dL (ref 2.5–4.5)

## 2017-07-18 LAB — VITAMIN B12: Vitamin B-12: 769 pg/mL (ref 232–1245)

## 2017-07-18 LAB — TSH: TSH: 1.91 u[IU]/mL (ref 0.450–4.500)

## 2017-07-18 LAB — MAGNESIUM: Magnesium: 2.2 mg/dL (ref 1.6–2.3)

## 2017-08-08 ENCOUNTER — Ambulatory Visit: Payer: Self-pay | Admitting: Pediatrics

## 2017-08-08 ENCOUNTER — Encounter: Payer: Self-pay | Admitting: Pediatrics

## 2017-08-08 VITALS — BP 122/64 | HR 69 | Temp 98.0°F | Resp 16 | Ht 59.0 in | Wt 200.6 lb

## 2017-08-08 DIAGNOSIS — J4540 Moderate persistent asthma, uncomplicated: Secondary | ICD-10-CM

## 2017-08-08 DIAGNOSIS — K219 Gastro-esophageal reflux disease without esophagitis: Secondary | ICD-10-CM

## 2017-08-08 DIAGNOSIS — J301 Allergic rhinitis due to pollen: Secondary | ICD-10-CM

## 2017-08-08 NOTE — Patient Instructions (Addendum)
Claritin 10 mg once a day for runny nose if needed Nasacort 1 spray per nostril twice a day if needed for stuffy nose Consider nasal saline rinses Symbicort 160- 2 puffs every 12 hours for coughing or wheezing Proventil 2 puffs every 4 hours if needed for wheezing or coughing spells Continue on your other medications Call me if you're not doing well on this treatment plan  Follow up in 1 year or sooner if needed

## 2017-08-08 NOTE — Progress Notes (Addendum)
[address] [place] [address] Dept: [phone]  FOLLOW UP NOTE  Patient ID: Brianna Nunez, female    DOB: May 05, 1960  Age: 57 y.o. MRN: [id number] Date of Office Visit: 08/08/2017  Assessment  Chief Complaint: Asthma (doing pretty well)  HPI Brianna Nunez is a 57 year old female who presents to the clinic for a follow up visit. She was last seen in this clinic on 08/02/2016 by Dr. Beaulah Dinning for evaluation of asthma, allergic rhinitis, and reflux. At that time, her asthma was well controlled and she continued on Symbicort 160 and albuterol as needed. She also continued Claritin, Nasocort and Omeprazole at that time.   At today's visit, she reports that her asthma has been moderately well controlled, however, about one week ago she was exposed to dust which aggravated her asthma. She continues to have symptoms of asthma when she is exposed to dust and strong odors, especially chemical odors. She developed asthma while working at the [employer] where she was exposed to cleaning chemicals. 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 She continues to use Symbicort 160 as well as an albuterol inhaler.  Allergic rhinitis is reported as not well controlled with symptoms including thick post nasal drainage and frequent throat clearing for which she is not currently using any medical intervention.  Reflux is reported as well controlled with occasional Tums.  Her current medications are listed in the chart.    Drug Allergies:  Allergies  Allergen Reactions  . Sulfa Antibiotics Hives  . Sulfamethoxazole-Trimethoprim Hives  . Oseltamivir Rash    tamiflu  . Penicillins Rash    Physical Exam: BP 122/64 (BP Location: Left Arm, Patient Position: Sitting, Cuff Size: Large)   Pulse 69   Temp 98 F (36.7 C) (Oral)   Resp 16   Ht 4\' 11"  (1.499 m)   Wt 200 lb 9.9 oz (91 kg)   SpO2 96%   BMI 40.52 kg/m    Physical Exam  Constitutional: She is  oriented to person, place, and time. She appears well-developed and well-nourished.  HENT:  Head: Normocephalic.  Right Ear: External ear normal.  Left Ear: External ear normal.  Bilateral nares slightly erythematous and edematous with no nasal drainage noted. Pharynx slightly erythematous with cobblestone appearance. Ears normal. Eyes normal.   Eyes: Conjunctivae are normal.  Neck: Normal range of motion. Neck supple.  Cardiovascular: Normal rate, regular rhythm and normal heart sounds.  No murmur noted  Pulmonary/Chest: Effort normal and breath sounds normal.  Lungs clear to auscultation  Musculoskeletal: Normal range of motion.  Neurological: She is alert and oriented to person, place, and time.  Skin: Skin is warm and dry.  Psychiatric: She has a normal mood and affect. Her behavior is normal. Judgment and thought content normal.    Diagnostics: FVC 2.75, FEV1 2.16. Predicted FVC 2.75, predicted FEV1 2.13. Spirometry is within the normal range.    Assessment and Plan: 1. Moderate persistent asthma without complication   2. Gastroesophageal reflux disease without esophagitis   3. Seasonal allergic rhinitis due to pollen      Patient Instructions  Claritin 10 mg once a day for runny nose if needed Nasacort 1 spray per nostril twice a day if needed for stuffy nose Consider nasal saline rinses Symbicort 160- 2 puffs every 12 hours for coughing or wheezing Proventil 2 puffs every 4 hours if needed for wheezing or coughing spells Continue on your other medications Call me if you're  not doing well on this treatment plan  Follow up in 1 year or sooner if needed   Return in about 1 year (around 08/09/2018), or if symptoms worsen or fail to improve.   Thank you for the opportunity to care for this patient.  Please do not hesitate to contact me with questions.  Thermon LeylandAnne Ambs, FNP [facility]  I have provided oversight  concerning Thermon Leylandnne Ambs' evaluation and treatment of this patient's health issues addressed during today's encounter. I agree with the assessment and therapeutic plan as outlined in the note.   Thank you for the opportunity to care for this patient.  Please do not hesitate to contact me with questions.  Tonette BihariJ. A. Vitaly Wanat, M.D.  [facility] [address] [place], [place] [address] [phone]

## 2017-08-11 ENCOUNTER — Telehealth: Payer: Self-pay

## 2017-08-11 NOTE — Telephone Encounter (Signed)
Pt would like a copy of recent bloodwork. Asked pt to stop by to sign a consent form. Told pt if wed or thurs we could try to process the medical record while she is here.

## 2017-08-14 ENCOUNTER — Other Ambulatory Visit: Payer: Self-pay | Admitting: Allergy

## 2017-08-14 MED ORDER — ALBUTEROL SULFATE HFA 108 (90 BASE) MCG/ACT IN AERS: 2.0000 | INHALATION_SPRAY | Freq: Four times a day (QID) | RESPIRATORY_TRACT | 2 refills | Status: DC | PRN

## 2017-08-14 MED ORDER — BUDESONIDE-FORMOTEROL FUMARATE 160-4.5 MCG/ACT IN AERO: 2.0000 | INHALATION_SPRAY | RESPIRATORY_TRACT | 5 refills | Status: DC | PRN

## 2017-08-14 MED ORDER — OMEPRAZOLE 20 MG PO CPDR: 20.0000 mg | DELAYED_RELEASE_CAPSULE | Freq: Two times a day (BID) | ORAL | 5 refills | Status: DC

## 2017-08-14 MED ORDER — ALBUTEROL SULFATE HFA 108 (90 BASE) MCG/ACT IN AERS: 2.0000 | INHALATION_SPRAY | RESPIRATORY_TRACT | 2 refills | Status: DC | PRN

## 2017-08-14 MED ORDER — BUDESONIDE-FORMOTEROL FUMARATE 160-4.5 MCG/ACT IN AERO: 2.0000 | INHALATION_SPRAY | Freq: Two times a day (BID) | RESPIRATORY_TRACT | 5 refills | Status: DC

## 2018-02-27 ENCOUNTER — Ambulatory Visit (INDEPENDENT_AMBULATORY_CARE_PROVIDER_SITE_OTHER): Admitting: Allergy & Immunology

## 2018-02-27 ENCOUNTER — Encounter: Payer: Self-pay | Admitting: Allergy & Immunology

## 2018-02-27 VITALS — BP 128/60 | HR 74 | Temp 98.1°F | Resp 20

## 2018-02-27 DIAGNOSIS — J4540 Moderate persistent asthma, uncomplicated: Secondary | ICD-10-CM | POA: Diagnosis not present

## 2018-02-27 MED ORDER — BUDESONIDE-FORMOTEROL FUMARATE 160-4.5 MCG/ACT IN AERO: 2.0000 | INHALATION_SPRAY | Freq: Two times a day (BID) | RESPIRATORY_TRACT | 5 refills | Status: DC

## 2018-02-27 MED ORDER — OMEPRAZOLE 20 MG PO CPDR: 20.0000 mg | DELAYED_RELEASE_CAPSULE | Freq: Two times a day (BID) | ORAL | 5 refills | Status: DC

## 2018-02-27 MED ORDER — ALBUTEROL SULFATE HFA 108 (90 BASE) MCG/ACT IN AERS: 2.0000 | INHALATION_SPRAY | RESPIRATORY_TRACT | 2 refills | Status: DC | PRN

## 2018-02-27 MED ORDER — CEFDINIR 300 MG PO CAPS: 300.0000 mg | ORAL_CAPSULE | Freq: Two times a day (BID) | ORAL | 0 refills | Status: DC

## 2018-02-27 NOTE — Patient Instructions (Addendum)
1. Moderate persistent asthma without complication - Lung testing looked fairly good, but it improved even more with the nebulizer treatment. - Start the steroid pack provided.  - Spacer use reviewed. - Daily controller medication(s): Symbicort 160/4.405mcg two puffs twice daily with spacer - Prior to physical activity: ProAir 2 puffs 10-15 minutes before physical activity. - Rescue medications: ProAir 4 puffs every 4-6 hours as needed - Asthma control goals:  * Full participation in all desired activities (may need albuterol before activity) * Albuterol use two time or less a week on average (not counting use with activity) * Cough interfering with sleep two time or less a month * Oral steroids no more than once a year * No hospitalizations  2. Chronic rhinitis - Continue with Nasacort one spray per nostril up to twice daily. - Continue with Claritin 10mg  daily.  3. GERD - Continue with omeprazole daily.   4. Bronchitis - Start cefdinir 300mg  twice daily for seven days. - The prednisone should also help your symptoms improve. - Call us with an update next week.  5. Return in about 6 months (around 08/29/2018).   Please inform us of any Emergency Department visits, hospitalizations, or changes in symptoms. Call us before going to the ED for breathing or allergy symptoms since we might be able to fit you in for a sick visit. Feel free to contact us anytime with any questions, problems, or concerns.  It was a pleasure to meet you today!  Websites that have reliable patient information: 1. American Academy of Asthma, Allergy, and Immunology: www.aaaai.org 2. Food Allergy Research and Education (FARE): foodallergy.org 3. Mothers of Asthmatics: http://www.asthmacommunitynetwork.org 4. American College of Allergy, Asthma, and Immunology: MissingWeapons.cawww.acaai.org   Make sure you are registered to vote! If you have moved or changed any of your contact information, you will need to get this updated  before voting!    Voter ID laws are going into effect in 2020! Be prepared! Check out LandscapingDigest.dkhttps://www.ncsbe.gov/voter-ID for more details.

## 2018-02-27 NOTE — Progress Notes (Signed)
FOLLOW UP  Date of Service/Encounter:  02/27/18   Assessment:   Moderate persistent asthma without complication  Acute bronchitis - starting cefdinir (s/p doxycycline earlier this month)  Chronic rhinitis  Plan/Recommendations:   1. Moderate persistent asthma without complication - Lung testing looked fairly good, but it improved even more with the nebulizer treatment. - Start the steroid pack provided.  - Spacer use reviewed. - Daily controller medication(s): Symbicort 160/4.85mcg two puffs twice daily with spacer - Prior to physical activity: ProAir 2 puffs 10-15 minutes before physical activity. - Rescue medications: ProAir 4 puffs every 4-6 hours as needed - Asthma control goals:  * Full participation in all desired activities (may need albuterol before activity) * Albuterol use two time or less a week on average (not counting use with activity) * Cough interfering with sleep two time or less a month * Oral steroids no more than once a year * No hospitalizations  2. Chronic rhinitis - Continue with Nasacort one spray per nostril up to twice daily. - Continue with Claritin 10mg  daily.  3. GERD - Continue with omeprazole daily.   4. Bronchitis - Start cefdinir 300mg  twice daily for seven days. - The prednisone should also help your symptoms improve. - Call us with an update next week.  5. Return in about 6 months (around 08/29/2018).  Subjective:   Brianna Nunez is a 57 y.o. female presenting today for follow up of No chief complaint on file.   Brianna Nunez has a history of the following: Patient Active Problem List   Diagnosis Date Noted  . Gastroesophageal reflux disease without esophagitis 08/02/2016  . Moderate persistent asthma without complication 07/29/2015  . Seasonal allergic rhinitis due to pollen 07/29/2015  . OSA (obstructive sleep apnea) 04/03/2014  . Dysuria 04/03/2014  . Screening for colon cancer 11/28/2013  . Hyperlipidemia  10/09/2013  . Morbid obesity (HCC) 10/09/2013  . Arthralgia of hip 06/30/2011    History obtained from: chart review and patient.  Brianna Nunez's Primary Care Provider is Patient, No Pcp Per.     Brianna Nunez is a 57 y.o. female presenting for a sick visit.  She was last seen in June 2019 by Dr. Beaulah DinningBardelas.  At that time, she was having worsening symptoms of moderate persistent asthma.  She remained on her Symbicort 160/4.5 mcg 2 puffs twice daily with albuterol as needed.  She has a history of occupational induced asthma, specifically with exposure to chemicals and strong odors.  Since the last visit, she has mostly done well. 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 However, since that time she has had problems with episodes of bronchitis. She was last working a few years ago, but she has had problems since that time. She continues to be covered by [employer] for this episode. She remains on Symbicort two puffs twice daily.   Most recently, she started feeling bad over Thanksgiving and symptoms have been ongoing since that time. She does endorse some postnasal drip and sinus pressure with a sore throat. She has having worsening asthma symptoms as well with increased use of her rescue inhaler. She was placed on doxycyline 100mg  BID in early December 2019. However despite this she has continued to have problems. She was not on steroids at all, but sometimes this does help. She did try coricidin without improvement.   Otherwise, there have been no changes to her past medical history, surgical history, family history, or  social history.    Review of Systems: a 14-point review of systems is pertinent for what is mentioned in HPI.  Otherwise, all other systems were negative.  Constitutional: negative other than that listed in the HPI Eyes: negative other than that listed in the HPI Ears, nose, mouth, throat, and face: negative other than that listed in the  HPI Respiratory: negative other than that listed in the HPI Cardiovascular: negative other than that listed in the HPI Gastrointestinal: negative other than that listed in the HPI Genitourinary: negative other than that listed in the HPI Integument: negative other than that listed in the HPI Hematologic: negative other than that listed in the HPI Musculoskeletal: negative other than that listed in the HPI Neurological: negative other than that listed in the HPI Allergy/Immunologic: negative other than that listed in the HPI    Objective:   Blood pressure 128/60, pulse 74, temperature 98.1 F (36.7 C), temperature source Oral, resp. rate 20, SpO2 98 %. There is no height or weight on file to calculate BMI.   Physical Exam:  General: Alert, interactive, in mild acute distress. Eyes: No conjunctival injection bilaterally, no discharge on the right, no discharge on the left and no Horner-Trantas dots present. PERRL bilaterally. EOMI without pain. No photophobia.  Ears: Right TM pearly gray with normal light reflex, Left TM pearly gray with normal light reflex, Right TM intact without perforation and Left TM intact without perforation.  Nose/Throat: External nose within normal limits and septum midline. Turbinates edematous with thick discharge. Posterior oropharynx erythematous with cobblestoning in the posterior oropharynx. Tonsils 2+ without exudates.  Tongue without thrush. Lungs: Decreased breath sounds with expiratory wheezing bilaterally. Coarse rhonchi midline. No increased work of breathing. CV: Normal S1/S2. No murmurs. Capillary refill <2 seconds.  Skin: Warm and dry, without lesions or rashes. Neuro:   Grossly intact. No focal deficits appreciated. Responsive to questions.  Diagnostic studies:   Spirometry: results normal (FEV1: 2.17/94%, FVC: 2.97/100%, FEV1/FVC: 73%).    Spirometry consistent with normal pattern. Albuterol nebulizer treatment given in clinic with no  improvement in the FEV1 or FVC, but her FEF25-75% did improve by 76%.   Allergy Studies: none      Malachi BondsJoel Aubrey Blackard, MD  [facility]

## 2018-03-01 ENCOUNTER — Encounter: Payer: Self-pay | Admitting: Allergy & Immunology

## 2018-07-31 ENCOUNTER — Other Ambulatory Visit: Payer: Self-pay

## 2018-07-31 ENCOUNTER — Encounter: Payer: Self-pay | Admitting: Allergy & Immunology

## 2018-07-31 ENCOUNTER — Ambulatory Visit (INDEPENDENT_AMBULATORY_CARE_PROVIDER_SITE_OTHER): Admitting: Allergy & Immunology

## 2018-07-31 VITALS — BP 122/58 | HR 70 | Temp 98.1°F | Resp 16 | Ht 59.0 in | Wt 211.0 lb

## 2018-07-31 DIAGNOSIS — K219 Gastro-esophageal reflux disease without esophagitis: Secondary | ICD-10-CM | POA: Diagnosis not present

## 2018-07-31 DIAGNOSIS — J301 Allergic rhinitis due to pollen: Secondary | ICD-10-CM

## 2018-07-31 DIAGNOSIS — J4540 Moderate persistent asthma, uncomplicated: Secondary | ICD-10-CM

## 2018-07-31 MED ORDER — ALBUTEROL SULFATE (2.5 MG/3ML) 0.083% IN NEBU: 2.5000 mg | INHALATION_SOLUTION | RESPIRATORY_TRACT | 1 refills | Status: DC | PRN

## 2018-07-31 MED ORDER — BUDESONIDE-FORMOTEROL FUMARATE 160-4.5 MCG/ACT IN AERO: 2.0000 | INHALATION_SPRAY | Freq: Two times a day (BID) | RESPIRATORY_TRACT | 5 refills | Status: DC

## 2018-07-31 MED ORDER — TRIAMCINOLONE ACETONIDE 55 MCG/ACT NA AERO: 2.0000 | INHALATION_SPRAY | Freq: Every day | NASAL | 5 refills | Status: DC

## 2018-07-31 MED ORDER — ALBUTEROL SULFATE HFA 108 (90 BASE) MCG/ACT IN AERS: 2.0000 | INHALATION_SPRAY | RESPIRATORY_TRACT | 1 refills | Status: DC | PRN

## 2018-07-31 MED ORDER — CETIRIZINE HCL 10 MG PO TABS: 10.0000 mg | ORAL_TABLET | Freq: Every day | ORAL | 5 refills | Status: DC

## 2018-07-31 NOTE — Progress Notes (Addendum)
FOLLOW UP  Date of Service/Encounter:  07/31/18   Assessment:   Moderate persistent asthma without complication - on workers compensation  Chronic rhinitis (grasses, dust mites)   GERD   Brianna Nunez presents for follow-up visit.  Unfortunately, she continues to have coughing.  It is very evident that this is secondary to noncompliance with the medication regimen.  I did again reemphasized the need to use her Symbicort every day to keep her from needing the rescue inhaler.  I also reiterated the need to use her reflux medication as well as her nasal spray on a routine basis as well.  I am optimistic that this will help improve her cough.  We did provide her with a nebulizer machine and will send in nebulizer solution as well.   We did fill out her workers compensation paperwork again today and wrote a Physicist, medicalletter. She needs to continue to avoid environments that trigger her asthma. Since she is stable at this time, I would be very hesitant to consider her returning to work given her history. She is comfortable with this plan.   Plan/Recommendations:   1. Moderate persistent asthma without complication - We are filling the paperwork and letter for the worker's compensation forms and we will get this information sent in.  - We did not do lung testing today since it can spread the coronavirus so effectively.  - You need to use your Symbicort twice daily EVERY DAY to keep you from needing the rescue inhaler and coughing. - Neb and teaching provided. - Daily controller medication(s): Symbicort 160/4.535mcg two puffs twice daily with spacer - Prior to physical activity: ProAir 2 puffs 10-15 minutes before physical activity. - Rescue medications: ProAir 4 puffs every 4-6 hours as needed or albuterol nebulizer one vial every 4-6 hours as needed - Asthma control goals:  * Full participation in all desired activities (may need albuterol before activity) * Albuterol use two time or less a week on  average (not counting use with activity) * Cough interfering with sleep two time or less a month * Oral steroids no more than once a year * No hospitalizations  2. Chronic rhinitis (grass, dust mites) - Continue with Nasacort one spray per nostril up to twice daily (this should be used daily for the best effect). - Start Zyrtec (cetirizine) 10mg  daily.  - You can get the generic at any drug store and even [employer] or [employer].  3. GERD - Continue with omeprazole daily.   4. Return in about 6 months (around 01/31/2019). This can be an in-person follow up visit.   Subjective:   Brianna Nunez is a 58 y.o. female presenting today for follow up of  Chief Complaint  Patient presents with  . WORKMANS COMP FOR ASTHMA  . CLEARING OF THROAT    Brianna GentaBarbara A Deguzman has a history of the following: Patient Active Problem List   Diagnosis Date Noted  . Gastroesophageal reflux disease without esophagitis 08/02/2016  . Moderate persistent asthma without complication 07/29/2015  . Seasonal allergic rhinitis due to pollen 07/29/2015  . OSA (obstructive sleep apnea) 04/03/2014  . Dysuria 04/03/2014  . Screening for colon cancer 11/28/2013  . Hyperlipidemia 10/09/2013  . Morbid obesity (HCC) 10/09/2013  . Arthralgia of hip 06/30/2011    History obtained from: chart review and patient.  Brianna Nunez is a 58 y.o. female presenting for a follow up visit.  We last saw her on Christmas Eve 2019.  At that time, her lung testing looked  fairly good.  We started her on a prednisone pack.  We continued Symbicort 160/4.5 mcg 2 puffs twice daily.  We also continued albuterol as needed. For her rhinitis, we continued with Nasacort 1 spray per nostril twice daily and Claritin 10 mg daily.  We continued her on omeprazole daily for her reflux.  We also diagnosed her with bronchitis and started cefdinir 300 mg twice daily for 7 days.  Since the last visit, she has mostly done well.   Asthma/Respiratory Symptom  History: She remains on the Symbicort but she tends to stop using it routinely once she is feeling good. She did complete her antibiotic from the last visit. She has not needed prednisone since the last visit.  She does think that she needs a nebulizer machine for albuterol today.  She has never had one, but she has used her aunts with improvement in symptoms.  She feels that it would prevent her from going into the hospital if needed.  ACT score is 22, indicating excellent asthma control.   Allergic Rhinitis Symptom History: She remains on Flonase, but is not using this on a routine basis.  She does have Claritin on board, but again she is not using this on a routine basis.  She reports that she has a somewhat productive cough, which seems to be worse in the mornings.  She has not needed antibiotics since last visit.  She tends to have the most problems in the spring season.  Otherwise, there have been no changes to her past medical history, surgical history, family history, or social history.  She remains uninsured, but does have Worker's Comp. in place.    Review of Systems  Constitutional: Negative.  Negative for chills, fever, malaise/fatigue and weight loss.  HENT: Negative.  Negative for congestion, ear discharge, ear pain and sore throat.        Positive for throat clearing.  Eyes: Negative for pain, discharge and redness.  Respiratory: Positive for cough. Negative for sputum production, shortness of breath and wheezing.   Cardiovascular: Negative.  Negative for chest pain and palpitations.  Gastrointestinal: Negative for abdominal pain, diarrhea, heartburn, nausea and vomiting.  Skin: Negative.  Negative for itching and rash.  Neurological: Negative for dizziness and headaches.  Endo/Heme/Allergies: Negative for environmental allergies. Does not bruise/bleed easily.       Objective:   Blood pressure (!) 122/58, pulse 70, temperature 98.1 F (36.7 C), temperature source Tympanic,  resp. rate 16, height 4\' 11"  (1.499 m), weight 211 lb (95.7 kg), SpO2 95 %. Body mass index is 42.62 kg/m.   Physical Exam:  Physical Exam  Constitutional: She appears well-developed and well-nourished.  Pleasant female.  Throat clearing during the exam.  HENT:  Head: Normocephalic and atraumatic.  Right Ear: Tympanic membrane, external ear and ear canal normal.  Left Ear: Tympanic membrane, external ear and ear canal normal.  Nose: Mucosal edema and rhinorrhea present. No nasal deformity or septal deviation. No epistaxis. Right sinus exhibits no maxillary sinus tenderness and no frontal sinus tenderness. Left sinus exhibits no maxillary sinus tenderness and no frontal sinus tenderness.  Mouth/Throat: Uvula is midline and oropharynx is clear and moist. Mucous membranes are not pale and not dry.  Cobblestoning present in the posterior oropharynx.  Eyes: Pupils are equal, round, and reactive to light. Conjunctivae and EOM are normal. Right eye exhibits no chemosis and no discharge. Left eye exhibits no chemosis and no discharge. Right conjunctiva is not injected. Left conjunctiva is not injected.  Cardiovascular: Normal rate, regular rhythm and normal heart sounds.  Respiratory: Effort normal and breath sounds normal. No accessory muscle usage. No tachypnea. No respiratory distress. She has no wheezes. She has no rhonchi. She has no rales. She exhibits no tenderness.  Lymphadenopathy:    She has no cervical adenopathy.  Neurological: She is alert.  Skin: No abrasion, no petechiae and no rash noted. Rash is not papular, not vesicular and not urticarial. No erythema. No pallor.  Sun spots over bilateral arms and neck.  Psychiatric: She has a normal mood and affect.     Diagnostic studies: none      Malachi Bonds, MD  [facility]

## 2018-07-31 NOTE — Patient Instructions (Addendum)
1. Moderate persistent asthma without complication - We did not do lung testing today since it can spread the coronavirus so effectively.  - You need to use your Symbicort twice daily EVERY DAY to keep you from needing the rescue inhaler and coughing. - Neb and teaching provided. - Daily controller medication(s): Symbicort 160/4.19mcg two puffs twice daily with spacer - Prior to physical activity: ProAir 2 puffs 10-15 minutes before physical activity. - Rescue medications: ProAir 4 puffs every 4-6 hours as needed or albuterol nebulizer one vial every 4-6 hours as needed - Asthma control goals:  * Full participation in all desired activities (may need albuterol before activity) * Albuterol use two time or less a week on average (not counting use with activity) * Cough interfering with sleep two time or less a month * Oral steroids no more than once a year * No hospitalizations  2. Chronic rhinitis - Continue with Nasacort one spray per nostril up to twice daily (this should be used daily for the best effect). - Start Zyrtec (cetirizine) 10mg  daily.  - You can get the generic at any drug store and even [employer] or [employer].  3. GERD - Continue with omeprazole daily.   4. Return in about 6 months (around 01/31/2019). This can be an in-person follow up visit.   Please inform us of any Emergency Department visits, hospitalizations, or changes in symptoms. Call us before going to the ED for breathing or allergy symptoms since we might be able to fit you in for a sick visit. Feel free to contact us anytime with any questions, problems, or concerns.  It was a pleasure to see you again today!  Websites that have reliable patient information: 1. American Academy of Asthma, Allergy, and Immunology: www.aaaai.org 2. Food Allergy Research and Education (FARE): foodallergy.org 3. Mothers of Asthmatics: http://www.asthmacommunitynetwork.org 4. American College of Allergy, Asthma, and Immunology:  www.acaai.org  "Like" [place] on Facebook and Instagram for our latest updates!      Make sure you are registered to vote! If you have moved or changed any of your contact information, you will need to get this updated before voting!    Voter ID laws are NOT going into effect for the General Election in November 2020! DO NOT let this stop you from exercising your right to vote!

## 2018-08-30 ENCOUNTER — Ambulatory Visit: Payer: Self-pay | Admitting: Allergy & Immunology

## 2018-11-09 ENCOUNTER — Other Ambulatory Visit: Payer: Self-pay

## 2018-11-09 DIAGNOSIS — Z20822 Contact with and (suspected) exposure to covid-19: Secondary | ICD-10-CM

## 2018-11-11 LAB — NOVEL CORONAVIRUS, NAA: SARS-CoV-2, NAA: NOT DETECTED

## 2019-02-05 ENCOUNTER — Encounter: Payer: Self-pay | Admitting: Allergy & Immunology

## 2019-02-05 ENCOUNTER — Ambulatory Visit (INDEPENDENT_AMBULATORY_CARE_PROVIDER_SITE_OTHER): Admitting: Allergy & Immunology

## 2019-02-05 ENCOUNTER — Other Ambulatory Visit: Payer: Self-pay

## 2019-02-05 VITALS — BP 142/70 | HR 77 | Temp 98.1°F | Resp 18 | Ht 59.45 in | Wt 224.0 lb

## 2019-02-05 DIAGNOSIS — J4540 Moderate persistent asthma, uncomplicated: Secondary | ICD-10-CM | POA: Diagnosis not present

## 2019-02-05 DIAGNOSIS — K219 Gastro-esophageal reflux disease without esophagitis: Secondary | ICD-10-CM

## 2019-02-05 DIAGNOSIS — J3089 Other allergic rhinitis: Secondary | ICD-10-CM | POA: Diagnosis not present

## 2019-02-05 DIAGNOSIS — J302 Other seasonal allergic rhinitis: Secondary | ICD-10-CM | POA: Diagnosis not present

## 2019-02-05 NOTE — Patient Instructions (Addendum)
1. Moderate persistent asthma without complication - Lung testing looked completely normal.  - Bring the paperwork by and we can work on getting that nebulizer machine covered.  - Daily controller medication(s): Symbicort 160/4.53mcg two puffs twice daily with spacer - Prior to physical activity: ProAir 2 puffs 10-15 minutes before physical activity. - Rescue medications: ProAir 4 puffs every 4-6 hours as needed or albuterol nebulizer one vial every 4-6 hours as needed - Asthma control goals:  * Full participation in all desired activities (may need albuterol before activity) * Albuterol use two time or less a week on average (not counting use with activity) * Cough interfering with sleep two time or less a month * Oral steroids no more than once a year * No hospitalizations  2. Chronic rhinitis - Continue with Nasacort one spray per nostril up to twice daily (this should be used daily for the best effect). - Continue with Zyrtec (cetirizine) 10mg  daily.   3. GERD - Continue with omeprazole daily.   4. Return in about 1 year (around 02/05/2020). This can be an in-person, a virtual Webex or a telephone follow up visit.   Please inform us of any Emergency Department visits, hospitalizations, or changes in symptoms. Call us before going to the ED for breathing or allergy symptoms since we might be able to fit you in for a sick visit. Feel free to contact us anytime with any questions, problems, or concerns.  It was a pleasure to see you again today!  Websites that have reliable patient information: 1. American Academy of Asthma, Allergy, and Immunology: www.aaaai.org 2. Food Allergy Research and Education (FARE): foodallergy.org 3. Mothers of Asthmatics: http://www.asthmacommunitynetwork.org 4. American College of Allergy, Asthma, and Immunology: www.acaai.org  "Like" [place] on Facebook and Instagram for our latest updates!      Make sure you are registered to vote! If you have moved or  changed any of your contact information, you will need to get this updated before voting!  In some cases, you MAY be able to register to vote online: [email or web address]

## 2019-02-05 NOTE — Progress Notes (Signed)
FOLLOW UP  Date of Service/Encounter:  02/05/19   Assessment:   Moderate persistent asthma without complication - on workers compensation  Chronic rhinitis (grasses, dust mites)   GERD  Plan/Recommendations:   1. Moderate persistent asthma without complication - Lung testing looked completely normal.  - Bring the paperwork by and we can work on getting that nebulizer machine covered.  - Daily controller medication(s): Symbicort 160/4.61mcg two puffs twice daily with spacer - Prior to physical activity: ProAir 2 puffs 10-15 minutes before physical activity. - Rescue medications: ProAir 4 puffs every 4-6 hours as needed or albuterol nebulizer one vial every 4-6 hours as needed - Asthma control goals:  * Full participation in all desired activities (may need albuterol before activity) * Albuterol use two time or less a week on average (not counting use with activity) * Cough interfering with sleep two time or less a month * Oral steroids no more than once a year * No hospitalizations  2. Chronic rhinitis - Continue with Nasacort one spray per nostril up to twice daily (this should be used daily for the best effect). - Continue with Zyrtec (cetirizine) 10mg  daily.   3. GERD - Continue with omeprazole daily.   4. Return in about 1 year (around 02/05/2020). This can be an in-person, a virtual Webex or a telephone follow up visit.  Subjective:   Brianna Nunez is a 58 y.o. female presenting today for follow up of  Chief Complaint  Patient presents with  . Asthma    feels like something is laying on her throat, not today though    Brianna Nunez has a history of the following: Patient Active Problem List   Diagnosis Date Noted  . Gastroesophageal reflux disease without esophagitis 08/02/2016  . Moderate persistent asthma without complication 78/29/5621  . Seasonal allergic rhinitis due to pollen 07/29/2015  . OSA (obstructive sleep apnea) 04/03/2014  .  Dysuria 04/03/2014  . Screening for colon cancer 11/28/2013  . Hyperlipidemia 10/09/2013  . Morbid obesity ([place]) 10/09/2013  . Arthralgia of hip 06/30/2011    History obtained from: chart review and patient.  Brianna Nunez is a 58 y.o. female presenting for a follow up visit. She has a history of moderate persistent asthma which has been precipitated by her previous work environment at [employer]. At the last visit in May 2020, we continued with her Symbicort two puffs BID in combination with albuterol as needed. We did provide her with a nebulizer machine to help her get through her illness and keep her out of the hospital and ED.  Since the last visit, she has mostly done well. She does like having the nebulizer machine on hand.   Asthma/Respiratory Symptom History: She is not using the nebulizer very often at all. But she did have to pay around $300 for it because apparently the Workers Comp did not cover this at all. She does report that she has had some chest congestion for around 2-3 months. She finally did have some improvement with it.   Allergic Rhinitis Symptom History: She does report some congestion with working indoors around dusty environments. She also has some problems with scents and cleaning products. She has not needed any antibiotics at all.   Eczema Symptom History: She has seen a Dermarologist for a rash. Evidently this was attributed to "the sun". She does have a prescription ointment that she uses as needed.   Otherwise, there have been no changes to her past medical history, surgical  history, family history, or social history.    Review of Systems  Constitutional: Negative.  Negative for chills, fever, malaise/fatigue and weight loss.  HENT: Negative.  Negative for congestion, ear discharge and ear pain.   Eyes: Negative for pain, discharge and redness.  Respiratory: Positive for shortness of breath. Negative for cough, sputum production and wheezing.   Cardiovascular:  Negative.  Negative for chest pain and palpitations.  Gastrointestinal: Negative for abdominal pain, constipation, diarrhea, heartburn, nausea and vomiting.  Skin: Negative.  Negative for itching and rash.  Neurological: Negative for dizziness and headaches.  Endo/Heme/Allergies: Negative for environmental allergies. Does not bruise/bleed easily.       Objective:   Blood pressure (!) 142/70, pulse 77, temperature 98.1 F (36.7 C), temperature source Temporal, resp. rate 18, height 4' 11.45" (1.51 m), weight 224 lb (101.6 kg), SpO2 98 %. Body mass index is 44.56 kg/m.   Physical Exam:  Physical Exam  Constitutional: She appears well-developed.  Interactive female. Pleasant.   HENT:  Head: Normocephalic and atraumatic.  Right Ear: Tympanic membrane, external ear and ear canal normal.  Left Ear: Tympanic membrane and ear canal normal.  Nose: No mucosal edema, rhinorrhea, nasal deformity or septal deviation. No epistaxis. Right sinus exhibits no maxillary sinus tenderness and no frontal sinus tenderness. Left sinus exhibits no maxillary sinus tenderness and no frontal sinus tenderness.  Mouth/Throat: Uvula is midline and oropharynx is clear and moist. Mucous membranes are not pale and not dry.  Eyes: Pupils are equal, round, and reactive to light. Conjunctivae and EOM are normal. Right eye exhibits no chemosis and no discharge. Left eye exhibits no chemosis and no discharge. Right conjunctiva is not injected. Left conjunctiva is not injected.  Cardiovascular: Normal rate, regular rhythm and normal heart sounds.  Respiratory: Effort normal and breath sounds normal. No accessory muscle usage. No tachypnea. No respiratory distress. She has no wheezes. She has no rhonchi. She has no rales. She exhibits no tenderness.  Moving air well in all lung fields. No increased work of breathing noted.   Lymphadenopathy:    She has no cervical adenopathy.  Neurological: She is alert.  Skin: No  abrasion, no petechiae and no rash noted. Rash is not papular, not vesicular and not urticarial. No erythema. No pallor.  Psychiatric: She has a normal mood and affect.     Diagnostic studies:    Spirometry: results normal (FEV1: 2.26/107%, FVC: 2.88/104%, FEV1/FVC: 78%).    Spirometry consistent with normal pattern.   Allergy Studies: none       Malachi Bonds, MD  [facility]

## 2019-02-12 ENCOUNTER — Telehealth: Payer: Self-pay

## 2019-02-12 NOTE — Telephone Encounter (Signed)
Dr. Ernst Bowler received a bill form [facility] on behalf of Brianna Nunez. Unclear as to what we need to do to get this billed coved and left a voice message regarding if she had any other paperwork or if we need to get a letter generated for her to get insurance to pay for the equipment.

## 2019-02-12 NOTE — Telephone Encounter (Signed)
Documents of last OV and letter were mail to [place] dept of [employer] fundraiser of Workers [employer] [address] [place] [address]

## 2019-02-12 NOTE — Telephone Encounter (Signed)
Patient stated she needs a generated letter stating that workers comp will cover the bill regarding her nebulizer machine with kit and it should go to [place] dept. Of Labor workers [employer] number [phone]. Patient did advise that it must be coded in a certain way so they would pay for it and should have someone in the billing department find out more information.

## 2019-02-13 NOTE — Telephone Encounter (Signed)
I called the patient back, after speaking with Garlon Hatchet, and let her know he send the letter to [place] [employer], Worker's Comp and that they should be receiving it in about a week. She stated she understood and was grateful.

## 2019-02-13 NOTE — Telephone Encounter (Signed)
Patient called back this morning with a phone number for the billing office for her workers comp claim. [phone]. She said if you have any information, to please let her know.

## 2019-02-26 ENCOUNTER — Telehealth: Payer: Self-pay

## 2019-02-26 NOTE — Telephone Encounter (Signed)
After reviewing the form patient must complete her portion and send a copy of the bill statement back to [facility] and the OWCP-915 form with it to the designated location. Patient will be by later to collect form.

## 2019-02-26 NOTE — Telephone Encounter (Signed)
OWCP forms printed to have Dr.Gallagher sign

## 2019-02-26 NOTE — Telephone Encounter (Signed)
Patient called stating she received something from [facility] regarding an incorrect form for nebulizer.  Please Advise.

## 2019-08-01 ENCOUNTER — Ambulatory Visit (INDEPENDENT_AMBULATORY_CARE_PROVIDER_SITE_OTHER): Admitting: Allergy & Immunology

## 2019-08-01 ENCOUNTER — Other Ambulatory Visit: Payer: Self-pay

## 2019-08-01 ENCOUNTER — Encounter: Payer: Self-pay | Admitting: Allergy & Immunology

## 2019-08-01 ENCOUNTER — Telehealth: Payer: Self-pay | Admitting: *Deleted

## 2019-08-01 VITALS — BP 128/76 | Temp 97.4°F | Resp 20

## 2019-08-01 DIAGNOSIS — K219 Gastro-esophageal reflux disease without esophagitis: Secondary | ICD-10-CM

## 2019-08-01 DIAGNOSIS — J4540 Moderate persistent asthma, uncomplicated: Secondary | ICD-10-CM

## 2019-08-01 DIAGNOSIS — J302 Other seasonal allergic rhinitis: Secondary | ICD-10-CM

## 2019-08-01 DIAGNOSIS — J3089 Other allergic rhinitis: Secondary | ICD-10-CM

## 2019-08-01 MED ORDER — OMEPRAZOLE 20 MG PO CPDR: 20.0000 mg | DELAYED_RELEASE_CAPSULE | Freq: Every day | ORAL | 5 refills | Status: DC

## 2019-08-01 MED ORDER — TRIAMCINOLONE ACETONIDE 55 MCG/ACT NA AERO: 1.0000 | INHALATION_SPRAY | Freq: Two times a day (BID) | NASAL | 5 refills | Status: DC

## 2019-08-01 MED ORDER — BUDESONIDE-FORMOTEROL FUMARATE 160-4.5 MCG/ACT IN AERO: 2.0000 | INHALATION_SPRAY | Freq: Two times a day (BID) | RESPIRATORY_TRACT | 5 refills | Status: DC

## 2019-08-01 MED ORDER — MONTELUKAST SODIUM 10 MG PO TABS
10.0000 mg | ORAL_TABLET | Freq: Every day | ORAL | 5 refills | Status: DC
Start: 2019-08-01 — End: 2019-08-06

## 2019-08-01 NOTE — Patient Instructions (Addendum)
1. Moderate persistent asthma without complication - Lung testing looked completely normal today.  - Try to use your Symbicort at least once daily to help control your asthma/breathing.  - This needs to be taken EVERY DAY  for the best effect.  - Daily controller medication(s): Symbicort 160/4.28mcg two puffs twice daily with spacer - Prior to physical activity: ProAir 2 puffs 10-15 minutes before physical activity. - Rescue medications: ProAir 4 puffs every 4-6 hours as needed or albuterol nebulizer one vial every 4-6 hours as needed - Asthma control goals:   * Full participation in all desired activities (may need albuterol before activity) * Albuterol use two time or less a week on average (not counting use with activity) * Cough interfering with sleep two time or less a month * Oral steroids no more than once a year * No hospitalizations  2. Chronic rhinitis - Continue with Nasacort one spray per nostril up to twice daily (this should be used EVERY DAY for the best effect). - Stop the Zyrtec (cetirizine) 10mg  daily.   - Start Singulair (montelukast) 10mg  daily (this is generic so it should be cheap).   3. GERD - Use the omeprazole EVERY DAY to control your reflux. - Reflux can be "silent" in that you don't feel reflux but this can cause the throat clearing. - Use the omeprazole DAILY for one month and call with an update.  4. Return in about 6 months (around 02/01/2020). This can be an in-person, a virtual Webex or a telephone follow up visit.   Please inform us of any Emergency Department visits, hospitalizations, or changes in symptoms. Call 02/03/2020 before going to the ED for breathing or allergy symptoms since we might be able to fit you in for a sick visit. Feel free to contact us anytime with any questions, problems, or concerns.  It was a pleasure to see you again today!  Websites that have reliable patient information: 1. American Academy of Asthma, Allergy, and Immunology:  www.aaaai.org 2. Food Allergy Research and Education (FARE): foodallergy.org 3. Mothers of Asthmatics: http://www.asthmacommunitynetwork.org 4. American College of Allergy, Asthma, and Immunology: www.acaai.org   COVID-19 Vaccine Information can be found at: [place] For questions related to vaccine distribution or appointments, please email [email or web address] or call [phone].     "Like" [email or web address] on Facebook and Instagram for our latest updates!       HAPPY SPRING!  Make sure you are registered to vote! If you have moved or changed any of your contact information, you will need to get this updated before voting!  In some cases, you MAY be able to register to vote online: [phone]

## 2019-08-01 NOTE — Telephone Encounter (Signed)
Workers Comp paperwork has been placed in Dr. Ellouise Newer office for him to complete. Patient is aware that it can take 7-10 days for completion.

## 2019-08-02 ENCOUNTER — Encounter: Payer: Self-pay | Admitting: Allergy & Immunology

## 2019-08-02 NOTE — Progress Notes (Signed)
FOLLOW UP  Date of Service/Encounter:  08/02/19   Assessment:   Moderate persistent asthma without complication - with poor compliance  Seasonal and perennial allergic rhinitis  Gastroesophageal reflux disease  Plan/Recommendations:   1. Moderate persistent asthma without complication - Lung testing looked completely normal today.  - Try to use your Symbicort at least once daily to help control your asthma/breathing.  - This needs to be taken EVERY DAY  for the best effect.  - Daily controller medication(s): Symbicort 160/4.18mcg two puffs twice daily with spacer - Prior to physical activity: ProAir 2 puffs 10-15 minutes before physical activity. - Rescue medications: ProAir 4 puffs every 4-6 hours as needed or albuterol nebulizer one vial every 4-6 hours as needed - Asthma control goals:   * Full participation in all desired activities (may need albuterol before activity) * Albuterol use two time or less a week on average (not counting use with activity) * Cough interfering with sleep two time or less a month * Oral steroids no more than once a year * No hospitalizations  2. Chronic rhinitis - Continue with Nasacort one spray per nostril up to twice daily (this should be used EVERY DAY for the best effect). - Stop the Zyrtec (cetirizine) 10mg  daily.   - Start Singulair (montelukast) 10mg  daily (this is generic so it should be cheap).   3. GERD - Use the omeprazole EVERY DAY to control your reflux. - Reflux can be "silent" in that you don't feel reflux but this can cause the throat clearing. - Use the omeprazole DAILY for one month and call us with an update.  4. Return in about 6 months (around 02/01/2020). This can be an in-person, a virtual Webex or a telephone follow up visit.  Subjective:   Brianna Nunez is a 59 y.o. female presenting today for follow up of  Chief Complaint  Patient presents with  . Allergies    Congestion. Noted throat clearing. Zyrtec  doesn't help. Tried husband's Montelukast which helped.   . Asthma    Using rescue inhaler 2-3 times a week. Not taking Symbicort daily.     Brianna Nunez has a history of the following: Patient Active Problem List   Diagnosis Date Noted  . Gastroesophageal reflux disease without esophagitis 08/02/2016  . Moderate persistent asthma without complication 02/54/2706  . Seasonal allergic rhinitis due to pollen 07/29/2015  . OSA (obstructive sleep apnea) 04/03/2014  . Dysuria 04/03/2014  . Screening for colon cancer 11/28/2013  . Hyperlipidemia 10/09/2013  . Morbid obesity ([place]) 10/09/2013  . Arthralgia of hip 06/30/2011    History obtained from: chart review and patient.  Brianna Nunez is a 59 y.o. female presenting for a follow up visit. She is a previous patient of Dr. Shaune Leeks. At the last visit in December 2020, she was continued on Symbicort 2 puffs in the morning and 2 puffs at night as well as albuterol as needed. For her rhinitis, she was continued on Nasacort as well as Zyrtec. Her GERD was controlled with omeprazole daily. She is currently uninsured, but her asthma management is covered by [employer].   Since last visit, she has done fairly well. She has not needed any steroids or antibiotics for any issues. However, she has developed throat clearing which is irritating to both her and her husband.   Asthma/Respiratory Symptom History: She is not using her Symbicort on a daily basis. She is only using it as needed. She estimates that she uses it  3-4 times per week at the most. She has not needed steroids for any exacerbations and has not been to the emergency room. She does not have a spacer.   Allergic Rhinitis Symptom History: She is having a lot of throat clearing. She reports thick congestion in her throat. She denies heart burn. She does have omeprazole, but she is does not use it all of the time. She will use it for a couple of weeks. She only really takes it when  her stomach bothers her.   Otherwise, there have been no changes to her past medical history, surgical history, family history, or social history.    Review of Systems  Constitutional: Negative.  Negative for chills, fever, malaise/fatigue and weight loss.  HENT: Positive for congestion and sinus pain. Negative for ear discharge and ear pain.        Positive for throat clearing.  Eyes: Negative for pain, discharge and redness.  Respiratory: Positive for cough. Negative for sputum production, shortness of breath and wheezing.   Cardiovascular: Negative.  Negative for chest pain and palpitations.  Gastrointestinal: Negative for abdominal pain, constipation, diarrhea, heartburn, nausea and vomiting.  Skin: Negative.  Negative for itching and rash.  Neurological: Negative for dizziness and headaches.  Endo/Heme/Allergies: Positive for environmental allergies. Does not bruise/bleed easily.       Objective:   Blood pressure 128/76, temperature (!) 97.4 F (36.3 C), temperature source Temporal, resp. rate 20. There is no height or weight on file to calculate BMI.    Physical Exam  Constitutional: She appears well-developed.  Constant throat clearing.  HENT:  Head: Normocephalic and atraumatic.  Right Ear: Tympanic membrane, external ear and ear canal normal. No drainage, swelling or tenderness. Tympanic membrane is not injected, not scarred, not erythematous, not retracted and not bulging.  Left Ear: Tympanic membrane, external ear and ear canal normal. No drainage, swelling or tenderness. Tympanic membrane is not injected, not scarred, not erythematous, not retracted and not bulging.  Nose: Mucosal edema and rhinorrhea present. No nasal deformity or septal deviation. No epistaxis. Right sinus exhibits no maxillary sinus tenderness and no frontal sinus tenderness. Left sinus exhibits no maxillary sinus tenderness and no frontal sinus tenderness.  Mouth/Throat: Uvula is midline and  oropharynx is clear and moist. Mucous membranes are not pale and not dry.  Turbinates slightly enlarged. Oropharynx appears clear. There is no cobblestoning.  Eyes: Pupils are equal, round, and reactive to light. Conjunctivae and EOM are normal. Right eye exhibits no chemosis and no discharge. Left eye exhibits no chemosis and no discharge. Right conjunctiva is not injected. Left conjunctiva is not injected.  Allergic shiners bilaterally.  Cardiovascular: Normal rate, regular rhythm and normal heart sounds.  Respiratory: Effort normal and breath sounds normal. No accessory muscle usage. No tachypnea. No respiratory distress. She has no wheezes. She has no rhonchi. She has no rales. She exhibits no tenderness.  Moving air well in all lung fields. No increased work of breathing.  Lymphadenopathy:  Head (right side): No submandibular, no tonsillar and no occipital adenopathy present.  Head (left side): No submandibular, no tonsillar and no occipital adenopathy present.  She has no cervical adenopathy.  Neurological: She is alert.  Skin: No abrasion, no petechiae and no rash noted. Rash is not papular, not vesicular and not urticarial. No erythema. No pallor.  Psychiatric: She has a normal mood and affect.     Diagnostic studies:   Spirometry: results normal (FEV1: 2.08/97%, FVC: 2.54/91%, FEV1/FVC: 82%).  Spirometry consistent with normal pattern.   Allergy Studies: none       Malachi Bonds, MD  [facility]

## 2019-08-06 ENCOUNTER — Other Ambulatory Visit: Payer: Self-pay | Admitting: *Deleted

## 2019-08-06 ENCOUNTER — Telehealth: Payer: Self-pay | Admitting: Allergy & Immunology

## 2019-08-06 MED ORDER — ALBUTEROL SULFATE (2.5 MG/3ML) 0.083% IN NEBU: 2.5000 mg | INHALATION_SOLUTION | RESPIRATORY_TRACT | 1 refills | Status: DC | PRN

## 2019-08-06 MED ORDER — TRIAMCINOLONE ACETONIDE 55 MCG/ACT NA AERO: 1.0000 | INHALATION_SPRAY | Freq: Two times a day (BID) | NASAL | 5 refills | Status: DC

## 2019-08-06 MED ORDER — MONTELUKAST SODIUM 10 MG PO TABS: 10.0000 mg | ORAL_TABLET | Freq: Every day | ORAL | 5 refills | Status: DC

## 2019-08-06 MED ORDER — BUDESONIDE-FORMOTEROL FUMARATE 160-4.5 MCG/ACT IN AERO: 2.0000 | INHALATION_SPRAY | Freq: Two times a day (BID) | RESPIRATORY_TRACT | 5 refills | Status: DC

## 2019-08-06 MED ORDER — ALBUTEROL SULFATE HFA 108 (90 BASE) MCG/ACT IN AERS: 2.0000 | INHALATION_SPRAY | RESPIRATORY_TRACT | 1 refills | Status: DC | PRN

## 2019-08-06 MED ORDER — CETIRIZINE HCL 10 MG PO TABS: 10.0000 mg | ORAL_TABLET | Freq: Every day | ORAL | 5 refills | Status: DC

## 2019-08-06 MED ORDER — OMEPRAZOLE 20 MG PO CPDR: 20.0000 mg | DELAYED_RELEASE_CAPSULE | Freq: Every day | ORAL | 5 refills | Status: DC

## 2019-08-06 NOTE — Telephone Encounter (Signed)
Patient called and stated that she needed to have added to her workers comp that dust and strong fumes flare her asthma.

## 2019-08-06 NOTE — Telephone Encounter (Signed)
Patient called and said that she needed to add something to her workman comp. Note .needs to speak with a nurse or gallagher. [phone].

## 2019-08-08 NOTE — Addendum Note (Signed)
Addended by: Deborra Medina on: 08/08/2019 06:39 PM   Modules accepted: Orders

## 2019-08-12 NOTE — Telephone Encounter (Signed)
I filled out the [employer] forms to the best of my knowledge. Dust and fumes were discussed in the form. I reviewed previous notes from Dr. Beaulah Dinning dating back to her first appointment with him on July 13th, 2009.   I gave the forms to Stanley to send to the address. She also made a copy for her chart.   Malachi Bonds, MD [facility]

## 2019-08-12 NOTE — Telephone Encounter (Signed)
Copied, labeled, and placed in Bulk scan. Also mailed original copy's to the address listed on the form.

## 2020-02-05 ENCOUNTER — Telehealth: Payer: Self-pay | Admitting: Allergy & Immunology

## 2020-02-05 NOTE — Telephone Encounter (Signed)
Patient states she got COVID tested today due to having on and off fevers, body aches, and congestion. Patient thinks it is COVID but she is waiting for her results. Patient's husband started developing the same symptoms a few days after her so he has been tested too. Patient is using her nebulizer and inhalers which seems to be keeping everything loosened up. Patient has been told she needs to get an infusion; however she would like Dr. Ellouise Newer recommendation regarding the infusion. Patient does not have insurance and doesn't know if they would cover it.  Please advise.

## 2020-02-06 ENCOUNTER — Ambulatory Visit: Payer: Self-pay | Admitting: Allergy & Immunology

## 2020-02-06 NOTE — Telephone Encounter (Signed)
She can call and go through the screening process for the COVID19 monoclonal antibody infusions: [phone] to see whether you might be eligible for therapeutic antibody infusions (leave your name and they will call you back).   Malachi Bonds, MD [facility]

## 2020-02-06 NOTE — Telephone Encounter (Signed)
Patient called today today see about getting the covid infusion. Would like for someone to call back [phone].

## 2020-02-06 NOTE — Telephone Encounter (Signed)
Called patient and advised. Patient verbalized understanding.  

## 2020-02-07 ENCOUNTER — Telehealth: Payer: Self-pay | Admitting: Nurse Practitioner

## 2020-02-07 DIAGNOSIS — U071 COVID-19: Secondary | ICD-10-CM

## 2020-02-07 NOTE — Telephone Encounter (Signed)
Called to discuss with Brianna Nunez about Covid symptoms and the use of a monoclonal antibody infusion for those with mild to moderate Covid symptoms and at a high risk of hospitalization.     Patient received infusion at [facility] today.     Patient Active Problem List   Diagnosis Date Noted  . Gastroesophageal reflux disease without esophagitis 08/02/2016  . Moderate persistent asthma without complication 07/29/2015  . Seasonal allergic rhinitis due to pollen 07/29/2015  . OSA (obstructive sleep apnea) 04/03/2014  . Dysuria 04/03/2014  . Screening for colon cancer 11/28/2013  . Hyperlipidemia 10/09/2013  . Morbid obesity ([facility]) 10/09/2013  . Arthralgia of hip 06/30/2011     Consuello Masse, NP [phone] [email or web address]

## 2020-02-20 ENCOUNTER — Other Ambulatory Visit: Payer: Self-pay | Admitting: Allergy & Immunology

## 2020-09-03 ENCOUNTER — Telehealth: Payer: Self-pay | Admitting: Allergy & Immunology

## 2020-09-03 NOTE — Telephone Encounter (Signed)
Please advise to sending something else to help appt

## 2020-09-03 NOTE — Telephone Encounter (Signed)
Patient states she tested positive for covid on Monday maybe Tuesday. Patient states she feels pressure in her ears and nose. Patient is using saline for nose, her inhaler and ear drops however patient is not getting any relief. Patient would like to know what else she can do to get some relief.   If anything is sent in for patient, patient would like it to go to [facility] - [address] [place]   Best contact for patient is [phone]

## 2020-09-03 NOTE — Telephone Encounter (Signed)
Patient agrees to begin montelukast 10 mg once a day, stop cetirizine, begin nasal saline rinses and use Nasacort daily. She will call the clinic with worsening symptoms. She did not follow up at her regular scheduled appointment in November 2021.

## 2020-09-09 ENCOUNTER — Telehealth: Payer: Self-pay | Admitting: Family Medicine

## 2020-09-09 NOTE — Telephone Encounter (Signed)
She tends to not follow-up.  Can we reach out to her to see how she is doing?  Malachi Bonds, MD [facility]

## 2020-09-09 NOTE — Telephone Encounter (Signed)
LMOM for patient to return the call to give [place] an update

## 2020-09-09 NOTE — Telephone Encounter (Signed)
Sounds good

## 2020-09-09 NOTE — Telephone Encounter (Signed)
LMOM for patient to call with an update with COVID

## 2020-09-10 NOTE — Telephone Encounter (Signed)
Patient was returning your call from yesterday. [phone].

## 2020-09-10 NOTE — Telephone Encounter (Signed)
Called patient back to go over previous notes she said most of her symptoms are in her nose and head. She was outside earlier and had difficulty breath due to the heat outside. She used her rescue and is not getting better. She tested positive last Monday or Tuesday and did an at home test. She is worried about the cost of the visit/ medications from both urgent care and our office. She does not have a PCP or insurance at this time. She plans to call urgent care to see how much a visit will cost her. She stated she is doing better since taking the singular, but the linger symptoms in her nose and head with now greenish/ dark yellow mucus is concerning to her.

## 2020-09-10 NOTE — Telephone Encounter (Signed)
Can you please call this patient and check on how she is doing? Breathing and COVID symptoms? Thank you

## 2020-09-10 NOTE — Telephone Encounter (Signed)
Thank you. Glad to hear she's doing better. Can you please have her follow up with her PCP or make an in the clinic appointment here is she thinks she has a sinus issue? Thank you

## 2020-09-10 NOTE — Telephone Encounter (Signed)
I called the patient to check on her breathing and covid symptoms. She stated that the singular helped dry most of her mucus up. She is not having so much drainage and constant runny nose. Her mucus is more green than yellow now. She has not been to a doctor for COVID medication or antibiotics. She is just letting it run its course. Her ears are getting better not as stuffy. She has been having nose bleeds from when she had constant runny nose. She is worried about the mucus being green now and would like to know what the next step is to help with that. Her Breathing is doing better was using it rescue inhaler every 1-2 hours but now only uses it once or every other day.

## 2020-09-11 NOTE — Telephone Encounter (Signed)
We do have a discounted rate for uninsured patients. She could also call around to find a clinic that will maybe let her pay on a sliding scale according to income

## 2020-09-15 NOTE — Telephone Encounter (Signed)
She should definitely follow up with the doctor she saw in person as they will be able to evaluate her symptoms and response to the medications. Thank you

## 2020-09-15 NOTE — Telephone Encounter (Signed)
She went to last Friday went to urgent care and was given a ipratropium nasal spray it. Singular has still been helping with her symptoms. She still has headaches, sinus pressure mostly on the left side. The Urgent care she went to uses charity care to lower the cost of the visit. She was told to use the ipratropium nasal spray for five days and then stop. They did not give her prednisone or paxlovid for her Covid symptoms. She plans to call the doctor she saw Friday back to get an antibiotic to help with her symptoms. She has been suffering for 4 weeks now with no relief.

## 2020-10-01 ENCOUNTER — Other Ambulatory Visit: Payer: Self-pay

## 2020-10-01 ENCOUNTER — Encounter: Payer: Self-pay | Admitting: Family Medicine

## 2020-10-01 ENCOUNTER — Ambulatory Visit (INDEPENDENT_AMBULATORY_CARE_PROVIDER_SITE_OTHER): Admitting: Family Medicine

## 2020-10-01 ENCOUNTER — Telehealth: Payer: Self-pay | Admitting: Family Medicine

## 2020-10-01 VITALS — BP 132/70 | HR 79 | Temp 94.9°F | Resp 20 | Ht 59.0 in | Wt 228.0 lb

## 2020-10-01 DIAGNOSIS — K219 Gastro-esophageal reflux disease without esophagitis: Secondary | ICD-10-CM

## 2020-10-01 DIAGNOSIS — J4540 Moderate persistent asthma, uncomplicated: Secondary | ICD-10-CM

## 2020-10-01 DIAGNOSIS — J3089 Other allergic rhinitis: Secondary | ICD-10-CM | POA: Diagnosis not present

## 2020-10-01 DIAGNOSIS — R040 Epistaxis: Secondary | ICD-10-CM | POA: Diagnosis not present

## 2020-10-01 DIAGNOSIS — J302 Other seasonal allergic rhinitis: Secondary | ICD-10-CM | POA: Insufficient documentation

## 2020-10-01 MED ORDER — ALBUTEROL SULFATE HFA 108 (90 BASE) MCG/ACT IN AERS: 2.0000 | INHALATION_SPRAY | RESPIRATORY_TRACT | 1 refills | Status: DC | PRN

## 2020-10-01 MED ORDER — AZELASTINE HCL 0.1 % NA SOLN: 2.0000 | Freq: Two times a day (BID) | NASAL | 5 refills | Status: AC

## 2020-10-01 MED ORDER — ALBUTEROL SULFATE (2.5 MG/3ML) 0.083% IN NEBU: 2.5000 mg | INHALATION_SOLUTION | RESPIRATORY_TRACT | 1 refills | Status: DC | PRN

## 2020-10-01 MED ORDER — MONTELUKAST SODIUM 10 MG PO TABS: 10.0000 mg | ORAL_TABLET | Freq: Every day | ORAL | 5 refills | Status: DC

## 2020-10-01 NOTE — Patient Instructions (Addendum)
Asthma Continue montelukast 10 mg once a day to prevent cough or wheeze continue Symbicort 160-2 puffs twice a day with a spacer to prevent cough or wheeze Continue albuterol 2 puffs once every 4 hours as needed for cough or wheeze You may use albuterol 2 puffs 5 to 15 minutes before activity to decrease cough or wheeze  Allergic rhinitis Continue allergen avoidance measures directed toward grass pollen, tree pollen, and dust mites as listed below Continue Nasacort 2 sprays in each nostril once a day for stuffy nose. If you experience bloody nose, stop using this medication Continue azelastine 2 sprays in each nostril twice a day as needed for a runny nose or drainage Begin nasal saline gel as needed for a dry nose Consider saline nasal rinses as needed for nasal symptoms. Use this before any medicated nasal sprays for best result  Reflux Continue dietary and lifestyle modifications as listed below Restart omeprazole 20 mg once a day to prevent reflux. Take this medication 30 minutes before your first meal  Epistaxis Pinch both nostrils while leaning forward for at least 5 minutes before checking to see if the bleeding has stopped. If bleeding is not controlled within 5-10 minutes apply a cotton ball soaked with oxymetazoline (Afrin) to the bleeding nostril for a few seconds.  If the problem persists or worsens a referral to ENT for further evaluation may be necessary.  Call the clinic if this treatment plan is not working well for you.  Follow up in 6 months or sooner if needed.  Reducing Pollen Exposure The American Academy of Allergy, Asthma and Immunology suggests the following steps to reduce your exposure to pollen during allergy seasons. Do not hang sheets or clothing out to dry; pollen may collect on these items. Do not mow lawns or spend time around freshly cut grass; mowing stirs up pollen. Keep windows closed at night.  Keep car windows closed while driving. Minimize morning  activities outdoors, a time when pollen counts are usually at their highest. Stay indoors as much as possible when pollen counts or humidity is high and on windy days when pollen tends to remain in the air longer. Use air conditioning when possible.  Many air conditioners have filters that trap the pollen spores. Use a HEPA room air filter to remove pollen form the indoor air you breathe.   Control of Dust Mite Allergen Dust mites play a major role in allergic asthma and rhinitis. They occur in environments with high humidity wherever human skin is found. Dust mites absorb humidity from the atmosphere (ie, they do not drink) and feed on organic matter (including shed human and animal skin). Dust mites are a microscopic type of insect that you cannot see with the naked eye. High levels of dust mites have been detected from mattresses, pillows, carpets, upholstered furniture, bed covers, clothes, soft toys and any woven material. The principal allergen of the dust mite is found in its feces. A gram of dust may contain 1,000 mites and 250,000 fecal particles. Mite antigen is easily measured in the air during house cleaning activities. Dust mites do not bite and do not cause harm to humans, other than by triggering allergies/asthma.  Ways to decrease your exposure to dust mites in your home:  1. Encase mattresses, box springs and pillows with a mite-impermeable barrier or cover  2. Wash sheets, blankets and drapes weekly in hot water (130 F) with detergent and dry them in a dryer on the hot setting.  3.  Have the room cleaned frequently with a vacuum cleaner and a damp dust-mop. For carpeting or rugs, vacuuming with a vacuum cleaner equipped with a high-efficiency particulate air (HEPA) filter. The dust mite allergic individual should not be in a room which is being cleaned and should wait 1 hour after cleaning before going into the room.  4. Do not sleep on upholstered furniture (eg, couches).  5. If  possible removing carpeting, upholstered furniture and drapery from the home is ideal. Horizontal blinds should be eliminated in the rooms where the person spends the most time (bedroom, study, television room). Washable vinyl, roller-type shades are optimal.  6. Remove all non-washable stuffed toys from the bedroom. Wash stuffed toys weekly like sheets and blankets above.  7. Reduce indoor humidity to less than 50%. Inexpensive humidity monitors can be purchased at most hardware stores. Do not use a humidifier as can make the problem worse and are not recommended.

## 2020-10-01 NOTE — Telephone Encounter (Signed)
Patient came in this morning and said that the note you gave her needs to be workman comp. Related .[employer] would like for you to call her thia athernoon. [phone].

## 2020-10-01 NOTE — Progress Notes (Signed)
[address] [place] [address] Dept: [phone]  FOLLOW UP NOTE  Patient ID: Brianna Nunez, female    DOB: Jun 09, 1960  Age: 60 y.o. MRN: [id number] Date of Office Visit: 10/01/2020  Assessment  Chief Complaint: Asthma (Throat congestion, little head and nose pressure, some cough)  HPI Brianna Nunez is a 60 year old female who presents the clinic for follow-up visit.  She was last seen in this clinic on 08/01/2019 by Dr. Dellis Anes for evaluation of asthma, allergic rhinitis, and reflux.  In the interim she has had 2 rounds of COVID with the latest diagnosis on 09/03/2020.  At today's visit, she reports her asthma has been moderately well controlled with symptoms including shortness of breath occasionally with activity, wheezing occasionally when outside, and cough producing mucus that began about a month ago when she had COVID.  She reports this cough is beginning to subside and it is not producing mucus any longer.  She continues Symbicort 160-2 puffs twice a day, montelukast 10 mg once a day, and uses albuterol about 4 times a week since having COVID.  She reports that before COVID she rarely used albuterol.  Allergic rhinitis is reported as moderately well controlled with symptoms including nasal congestion which has been improving, occasional sneeze, postnasal drainage and dry nostrils.  She is not currently using Nasacort, azelastine, or nasal saline rinses.  Reflux is reported as not well controlled with heartburn occurring about 3 to 4 days a week.  She had previously taken omeprazole with relief of symptoms, however, she has been out of this medication for quite some time.  She does report epistaxis that began about 1 month ago when she got COVID and has been occurring daily until about 1 week ago.  She reports bleeding from both nostrils however, the left side is more affected than the right side.  She reports that bleeding stops and under 5 minutes with no medical  intervention.  Her current medications are listed in the chart.  Drug Allergies:  Allergies  Allergen Reactions   Sulfa Antibiotics Hives   Sulfamethoxazole-Trimethoprim Hives   Oseltamivir Rash    tamiflu   Penicillins Rash    Physical Exam: BP 132/70   Pulse 79   Temp (!) 94.9 F (34.9 C) (Temporal)   Resp 20   Ht 4\' 11"  (1.499 m)   Wt 228 lb (103.4 kg)   SpO2 96%   BMI 46.05 kg/m    Physical Exam Vitals reviewed.  Constitutional:      Appearance: Normal appearance.  HENT:     Head: Normocephalic and atraumatic.     Right Ear: Tympanic membrane normal.     Left Ear: Tympanic membrane normal.     Nose:     Comments: Bilateral naris slightly erythematous with clear nasal drainage noted.  Pharynx normal.  Ears normal.  Eyes normal.    Mouth/Throat:     Pharynx: Oropharynx is clear.  Eyes:     Conjunctiva/sclera: Conjunctivae normal.  Cardiovascular:     Rate and Rhythm: Normal rate and regular rhythm.     Heart sounds: Normal heart sounds. No murmur heard. Pulmonary:     Effort: Pulmonary effort is normal.     Breath sounds: Normal breath sounds.     Comments: Lungs clear to auscultation Musculoskeletal:        General: Normal range of motion.     Cervical back: Normal range of motion and neck supple.  Skin:    General: Skin  is warm and dry.  Neurological:     Mental Status: She is alert and oriented to person, place, and time.  Psychiatric:        Mood and Affect: Mood normal.        Behavior: Behavior normal.        Thought Content: Thought content normal.        Judgment: Judgment normal.    Diagnostics: FVC 2.41, FEV1 2.06.  Predicted FVC 2.67, predicted FEV1 2.05.  Spirometry indicates normal ventilatory function.  Assessment and Plan: 1. Moderate persistent asthma without complication   2. Seasonal and perennial allergic rhinitis   3. Gastroesophageal reflux disease without esophagitis   4. Epistaxis     Meds ordered this encounter   Medications   montelukast (SINGULAIR) 10 MG tablet    Sig: Take 1 tablet (10 mg total) by mouth at bedtime.    Dispense:  30 tablet    Refill:  5   albuterol (PROVENTIL) (2.5 MG/3ML) 0.083% nebulizer solution    Sig: Take 3 mLs (2.5 mg total) by nebulization every 4 (four) hours as needed for wheezing or shortness of breath.    Dispense:  75 mL    Refill:  1   albuterol (PROAIR HFA) 108 (90 Base) MCG/ACT inhaler    Sig: Inhale 2 puffs into the lungs every 4 (four) hours as needed.    Dispense:  18 g    Refill:  1   azelastine (ASTELIN) 0.1 % nasal spray    Sig: Place 2 sprays into both nostrils 2 (two) times daily. Use in each nostril as directed    Dispense:  30 mL    Refill:  5     Patient Instructions  Asthma Continue montelukast 10 mg once a day to prevent cough or wheeze continue Symbicort 160-2 puffs twice a day with a spacer to prevent cough or wheeze Continue albuterol 2 puffs once every 4 hours as needed for cough or wheeze You may use albuterol 2 puffs 5 to 15 minutes before activity to decrease cough or wheeze  Allergic rhinitis Continue allergen avoidance measures directed toward grass pollen, tree pollen, and dust mites as listed below Continue Nasacort 2 sprays in each nostril once a day for stuffy nose. If you experience bloody nose, stop using this medication Continue azelastine 2 sprays in each nostril twice a day as needed for a runny nose or drainage Begin nasal saline gel as needed for a dry nose Consider saline nasal rinses as needed for nasal symptoms. Use this before any medicated nasal sprays for best result  Reflux Continue dietary and lifestyle modifications as listed below Restart omeprazole 20 mg once a day to prevent reflux. Take this medication 30 minutes before your first meal  Epistaxis Pinch both nostrils while leaning forward for at least 5 minutes before checking to see if the bleeding has stopped. If bleeding is not controlled within 5-10  minutes apply a cotton ball soaked with oxymetazoline (Afrin) to the bleeding nostril for a few seconds.  If the problem persists or worsens a referral to ENT for further evaluation may be necessary.  Call the clinic if this treatment plan is not working well for you.  Follow up in 6 months or sooner if needed.   Return in about 6 months (around 04/03/2021), or if symptoms worsen or fail to improve.    Thank you for the opportunity to care for this patient.  Please do not hesitate to contact me with  questions.  Gareth Morgan, FNP [facility] [place] [facility]

## 2020-10-05 NOTE — Telephone Encounter (Signed)
Currently working on her [employer] and [employer]. Will be ready for pick up in the am. Please send in the tubing that she needs for her nebulizer. Thank you

## 2020-10-05 NOTE — Telephone Encounter (Signed)
Patient called stating she is in need of a new nebulizer kit. Patient had COVID and didn't want to reuse the same mask/cup. She is also requesting the spacer for her inhaler.  Patient is also following up to the message patsy sent to [facility] on Thursday.   Please [place]

## 2020-10-06 MED ORDER — NEBULIZER/TUBING/MOUTHPIECE KIT: 1.0000 | PACK | Freq: Every day | 0 refills | Status: AC | PRN

## 2020-10-06 MED ORDER — AEROCHAMBER PLUS MISC: 0 refills | Status: AC

## 2020-10-06 NOTE — Telephone Encounter (Addendum)
Tried calling pt, left a voicemail for a return call. Thurston Hole has completed her workers comp paperwork and she can either come to the [facility] office to pick up or I can mail it?   Copies were placed to be scanned.  I was also going to inform her of nurse Crees message:   Patient can also get tubing from [facility] medical supply store([address] as they carry tubing at low cost starting at seven dollars.

## 2020-10-06 NOTE — Telephone Encounter (Signed)
Pt called back and spoke with Fleet Contras- she requested forms be mailed.

## 2021-01-14 ENCOUNTER — Telehealth: Payer: Self-pay | Admitting: Family Medicine

## 2021-01-14 ENCOUNTER — Other Ambulatory Visit: Payer: Self-pay | Admitting: Internal Medicine

## 2021-01-14 MED ORDER — DOXYCYCLINE MONOHYDRATE 100 MG PO TABS: 100.0000 mg | ORAL_TABLET | Freq: Two times a day (BID) | ORAL | 0 refills | Status: AC

## 2021-01-14 MED ORDER — PREDNISONE 20 MG PO TABS: 40.0000 mg | ORAL_TABLET | Freq: Every day | ORAL | 0 refills | Status: AC

## 2021-01-14 NOTE — Progress Notes (Signed)
She has been having cough and increased congestion and yellow sputum for the past 8 days.  Mucus is thick and yellow.  She is waking up a night with wheezing and cough.  Using her albuterol two to three times per day with significant improvement in cough and chest tightness.   Her husband has similar symptoms and was started on antibiotics today.  She is concerned about the weekend coming up and doesn't want this to get worse where she ends up needing to go the emergency room.  She does not have insurance coverage I discussed the limitations of trying to treat her over the phone, but I understand her concerns.  We discussed burst of prednisone for what sounds like an asthma exacerbation.   She is not having fever, and has been sick for 8 days.  We discussed watchful waiting on antibiotics, but since our clinic is closed tomorrow and this weekend, I will call in some doxycycline to be taken 100 mg BID x 7 days should her symptoms worsen or fail to improve by the end of the weekend.  She denies having frequent antibiotic usage, and no prednisone this year.  She continues her Symbicort 160 2 puffs BID  for asthma.  Advised albuterol 2 puffs every 4-6 hours while awake for the next few days in addition to meds above.

## 2021-01-14 NOTE — Telephone Encounter (Signed)
Called and spoke with patient and she stated that her symptoms started Friday/Saturday. She did test negative for COVID. She is wondering what she can do. Will you please provide recommendation?

## 2021-01-14 NOTE — Telephone Encounter (Signed)
Patient called back and states that she does not think its Pneumonia or at least that it has gotten to that point.

## 2021-01-14 NOTE — Telephone Encounter (Signed)
Called and spoke with the patient and she stated that she never did check her temperature she stated that she just felt like she had a low grade fever. I advised your direction in regards to the chest x ray and she advised to me that she does not currently have insurance that would be able to cover the chest x ray.

## 2021-01-14 NOTE — Telephone Encounter (Signed)
Patient called and said that she has a cough and congestion and thick yellow mucus .she said it is worse at night and it feels like its thick in her chest. She is wheezing, using her neb. And got a bad call . Low grade fever. [facility] on [address] [phone]

## 2021-02-02 NOTE — Telephone Encounter (Signed)
Pt has been horse since she was sick a few weeks ago. Pt does have some congestion. She just wants her voice back. Doing all current meds.

## 2021-02-02 NOTE — Telephone Encounter (Signed)
Patient called and said that she did not have a voice and wanted to know what to do Brianna Nunez [place] [phone]

## 2021-04-08 ENCOUNTER — Other Ambulatory Visit: Payer: Self-pay

## 2021-04-08 ENCOUNTER — Ambulatory Visit (INDEPENDENT_AMBULATORY_CARE_PROVIDER_SITE_OTHER): Admitting: Allergy

## 2021-04-08 ENCOUNTER — Encounter: Payer: Self-pay | Admitting: Allergy

## 2021-04-08 DIAGNOSIS — J3089 Other allergic rhinitis: Secondary | ICD-10-CM

## 2021-04-08 DIAGNOSIS — J4541 Moderate persistent asthma with (acute) exacerbation: Secondary | ICD-10-CM

## 2021-04-08 DIAGNOSIS — J302 Other seasonal allergic rhinitis: Secondary | ICD-10-CM

## 2021-04-08 DIAGNOSIS — K219 Gastro-esophageal reflux disease without esophagitis: Secondary | ICD-10-CM | POA: Diagnosis not present

## 2021-04-08 DIAGNOSIS — J018 Other acute sinusitis: Secondary | ICD-10-CM

## 2021-04-08 MED ORDER — PREDNISONE 20 MG PO TABS: 20.0000 mg | ORAL_TABLET | Freq: Every day | ORAL | 0 refills | Status: AC

## 2021-04-08 MED ORDER — DOXYCYCLINE MONOHYDRATE 100 MG PO TABS: 100.0000 mg | ORAL_TABLET | Freq: Two times a day (BID) | ORAL | 0 refills | Status: AC

## 2021-04-08 NOTE — Progress Notes (Signed)
RE: MELODIE ASHWORTH MRN: [id number] DOB: 1960/11/26 Date of Telemedicine Visit: 04/08/2021  Referring provider: No ref. provider found Primary care provider: Patient, No Pcp Per (Inactive)  Chief Complaint: Sinusitis (Fever last night 99 degrees Farenheit.  Feels terrible but not sure what it is. Had aches. Sinus issues, ears hurting. Patient states that it is upper respiratory something. Didn't take anything for the fever just let it run its course. Two days prior to fever she had trouble breathing. She has been hoarse since November and just now breaking through this month. She also states that her husband was sick with something and she might have caught whatever he had.) and Asthma (Been doing pretty good off and on. Used nebulizer bc she couldn't get her breath for 3 days. )   Telemedicine Follow Up Visit via Telephone: I connected with Collene Massimino for a follow up on 04/08/21 by telephone and verified that I am speaking with the correct person using two identifiers.   I discussed the limitations, risks, security and privacy concerns of performing an evaluation and management service by telephone and the availability of in person appointments. I also discussed with the patient that there may be a patient responsible charge related to this service. The patient expressed understanding and agreed to proceed.  Patient is at home  Provider is at the office.  Visit start time: 1124 Visit end time: 1205 Insurance consent/check in by: Elmyra Ricks Medical consent and medical assistant/nurse: Tracye  History of Present Illness: She is a 61 y.o. female, who is being followed for asthma, allergic rhinitis, reflux, epistaxis. Her previous allergy office visit was on 10/01/20 with Gareth Morgan, [place].   She reports having chills, fever last night (up to 99).  Thus she called our office and made her follow-up visit in to a televisit.  She states her husband was sick last week and feels like she caught  what he had. She states her husband tested negative to Covid.    She reports having sinus congestion with a thick green mucus, ears hurting, headache, feels "terrible".  Several days ago she reports having trouble breathing.  She states Sunday/Monday she felt like she was having trouble breathing.  After that she started noticing more symptoms as above.  She states she has had some voice changes since November.    She is using her albuterol via nebulizer this week with symptoms.   She also started using nasal atrovent and does feel like is "broken loose" her mucus.    She continues to take singulair daily.  She is doing Symbicort 2 puffs twice a day "when having issues" and when doing fine she may stop use.  This week she states she has been using symbicort twice a day.    She states she has not been taking zyrtec as she was not sure she could take singulair.    She states she has not taken any anti-reflux medications in "a while" but she does feel like she may need to restart anti-reflux medication.  She states she has some protonix at home.     Assessment and Plan: Lavella is a 61 y.o. female with:   Sinus infection Worsening symptoms consistent with sinus infection will treat at this time with Doxycyline $RemoveBeforeD'100mg'[id number]$  twice a day x 10 days as well as prednisone $RemoveBefor'20mg'[id number]$  daily for 5 days.   Monitor for fever Make sure you are staying well hydrated and get adequate rest.   Can continue use of nasal Ipratropium  Asthma Continue montelukast 10 mg once a day to prevent cough or wheeze  Continue Symbicort 160-2 puffs twice a day with a spacer to prevent cough or wheeze Continue albuterol 2 puffs once every 4 hours as needed for cough or wheeze You may use albuterol 2 puffs 5 to 15 minutes before activity to decrease cough or wheeze  Asthma control goals:  Full participation in all desired activities (may need albuterol before activity) Albuterol use two time or less a week on average (not counting use  with activity) Cough interfering with sleep two time or less a month Oral steroids no more than once a year No hospitalizations  Allergic rhinitis Continue allergen avoidance measures directed toward grass pollen, tree pollen, and dust mites as listed below Use Nasacort 2 sprays each nostril daily for 1-2 weeks at a time before stopping once nasal congestion improves for maximum benefit Use azelastine 2 sprays in each nostril twice a day as needed for a runny nose Use nasal saline gel as needed for a dry nose Consider saline nasal rinses as needed for nasal symptoms. Use this before any medicated nasal sprays for best result  Reflux Continue dietary and lifestyle modifications as listed below Can use Prilosec or Protonix  once a day to prevent reflux. Take this medication 30 minutes before your first meal  Follow-up in 4-6 months or sooner if needed   Diagnostics: None.  Medication List:  Current Outpatient Medications  Medication Sig Dispense Refill   albuterol (PROVENTIL) (2.5 MG/3ML) 0.083% nebulizer solution Take 3 mLs (2.5 mg total) by nebulization every 4 (four) hours as needed for wheezing or shortness of breath. 75 mL 1   albuterol (VENTOLIN HFA) 108 (90 Base) MCG/ACT inhaler Inhale 2 puffs into the lungs every 6 (six) hours as needed for wheezing or shortness of breath.     azelastine (ASTELIN) 0.1 % nasal spray Place 2 sprays into both nostrils 2 (two) times daily. Use in each nostril as directed 30 mL 5   budesonide-formoterol (SYMBICORT) 160-4.5 MCG/ACT inhaler Inhale 2 puffs into the lungs 2 (two) times daily. 1 Inhaler 5   clobetasol ointment (TEMOVATE) 0.05 % Apply 2 times a day to affected areas.  Stop when smooth. Do not apply to face or skin folds.     Ibuprofen 200 MG CAPS Take 800 mg by mouth every 6 (six) hours as needed. Reported on 07/28/2015     montelukast (SINGULAIR) 10 MG tablet Take 1 tablet (10 mg total) by mouth at bedtime. 30 tablet 5    Respiratory Therapy Supplies (NEBULIZER/TUBING/MOUTHPIECE) KIT 1 each by Does not apply route daily as needed. 1 kit 0   Spacer/Aero-Holding Chambers (AEROCHAMBER PLUS) inhaler Use as instructed 1 each 0   triamcinolone (NASACORT) 55 MCG/ACT AERO nasal inhaler Place 1 spray into the nose 2 (two) times daily. (Patient taking differently: Place 1 spray into the nose as needed.) 16.5 g 5   cetirizine (ZYRTEC) 10 MG tablet Take 1 tablet (10 mg total) by mouth daily. 30 tablet 5   hydrocortisone 2.5 % ointment Apply topically 2 (two) times daily. Apply to affected areas on both arms (Patient not taking: Reported on 10/01/2020) 60 g 1   omeprazole (PRILOSEC) 20 MG capsule Take 1 capsule (20 mg total) by mouth daily. (Patient not taking: Reported on 10/01/2020) 30 capsule 5   No current facility-administered medications for this visit.   Allergies: Allergies  Allergen Reactions   Sulfa Antibiotics Hives   Sulfamethoxazole-Trimethoprim Hives  Oseltamivir Rash    tamiflu   Penicillins Rash   I reviewed her past medical history, social history, family history, and environmental history and no significant changes have been reported from previous visit on 10/01/20.  Review of Systems  Constitutional:  Positive for fever.  HENT:  Positive for congestion, ear pain, sinus pressure and voice change.   Eyes: Negative.   Respiratory:  Positive for shortness of breath.   Cardiovascular: Negative.   Gastrointestinal: Negative.   Musculoskeletal: Negative.   Skin: Negative.   Allergic/Immunologic: Negative.   Neurological: Negative.   Objective: Physical Exam Not obtained as encounter was done via telephone.   Previous notes and tests were reviewed.  I discussed the assessment and treatment plan with the patient. The patient was provided an opportunity to ask questions and all were answered. The patient agreed with the plan and demonstrated an understanding of the instructions.   The patient  was advised to call back or seek an in-person evaluation if the symptoms worsen or if the condition fails to improve as anticipated.  I provided 41 minutes of non-face-to-face time during this encounter.  It was my pleasure to participate in Yacolt Kuyper's care today. Please feel free to contact me with any questions or concerns.   Sincerely,  Ayris Carano Charmian Muff, MD

## 2021-04-08 NOTE — Patient Instructions (Addendum)
Sinus infection Worsening symptoms consistent with sinus infection will treat at this time with Doxycyline 100mg  twice a day x 10 days as well as prednisone 20mg  daily for 5 days.   Monitor for fever Make sure you are staying well hydrated and get adequate rest.   Can continue use of nasal Ipratropium   Asthma Continue montelukast 10 mg once a day to prevent cough or wheeze  Continue Symbicort 160-2 puffs twice a day with a spacer to prevent cough or wheeze Continue albuterol 2 puffs once every 4 hours as needed for cough or wheeze You may use albuterol 2 puffs 5 to 15 minutes before activity to decrease cough or wheeze  Asthma control goals:  Full participation in all desired activities (may need albuterol before activity) Albuterol use two time or less a week on average (not counting use with activity) Cough interfering with sleep two time or less a month Oral steroids no more than once a year No hospitalizations  Allergic rhinitis Continue allergen avoidance measures directed toward grass pollen, tree pollen, and dust mites as listed below Use Nasacort 2 sprays each nostril daily for 1-2 weeks at a time before stopping once nasal congestion improves for maximum benefit Use azelastine 2 sprays in each nostril twice a day as needed for a runny nose Use nasal saline gel as needed for a dry nose Consider saline nasal rinses as needed for nasal symptoms. Use this before any medicated nasal sprays for best result  Reflux Continue dietary and lifestyle modifications as listed below Can use Prilosec or Protonix  once a day to prevent reflux. Take this medication 30 minutes before your first meal  Follow-up in 4-6 months or sooner if needed

## 2023-03-07 ENCOUNTER — Other Ambulatory Visit: Payer: Self-pay

## 2023-03-07 ENCOUNTER — Encounter: Payer: Self-pay | Admitting: Internal Medicine

## 2023-03-07 ENCOUNTER — Telehealth: Payer: Self-pay | Admitting: Internal Medicine

## 2023-03-07 ENCOUNTER — Ambulatory Visit (INDEPENDENT_AMBULATORY_CARE_PROVIDER_SITE_OTHER): Admitting: Internal Medicine

## 2023-03-07 VITALS — BP 124/60 | HR 99 | Temp 98.4°F | Resp 18 | Ht 58.66 in | Wt 225.4 lb

## 2023-03-07 DIAGNOSIS — J302 Other seasonal allergic rhinitis: Secondary | ICD-10-CM

## 2023-03-07 DIAGNOSIS — J4541 Moderate persistent asthma with (acute) exacerbation: Secondary | ICD-10-CM

## 2023-03-07 DIAGNOSIS — J3089 Other allergic rhinitis: Secondary | ICD-10-CM | POA: Diagnosis not present

## 2023-03-07 DIAGNOSIS — J22 Unspecified acute lower respiratory infection: Secondary | ICD-10-CM | POA: Diagnosis not present

## 2023-03-07 MED ORDER — ALBUTEROL SULFATE HFA 108 (90 BASE) MCG/ACT IN AERS: 2.0000 | INHALATION_SPRAY | RESPIRATORY_TRACT | 1 refills | Status: DC | PRN

## 2023-03-07 MED ORDER — DOXYCYCLINE HYCLATE 100 MG PO CAPS: 100.0000 mg | ORAL_CAPSULE | Freq: Two times a day (BID) | ORAL | 0 refills | Status: DC

## 2023-03-07 MED ORDER — CETIRIZINE HCL 10 MG PO TABS: 10.0000 mg | ORAL_TABLET | Freq: Every day | ORAL | 5 refills | Status: AC | PRN

## 2023-03-07 MED ORDER — FLUTICASONE PROPIONATE 50 MCG/ACT NA SUSP: 2.0000 | Freq: Every day | NASAL | 5 refills | Status: AC

## 2023-03-07 MED ORDER — ALBUTEROL SULFATE (2.5 MG/3ML) 0.083% IN NEBU: 2.5000 mg | INHALATION_SOLUTION | RESPIRATORY_TRACT | 1 refills | Status: DC | PRN

## 2023-03-07 MED ORDER — BUDESONIDE-FORMOTEROL FUMARATE 160-4.5 MCG/ACT IN AERO: 2.0000 | INHALATION_SPRAY | Freq: Two times a day (BID) | RESPIRATORY_TRACT | 5 refills | Status: DC

## 2023-03-07 MED ORDER — PREDNISONE 10 MG PO TABS: ORAL_TABLET | ORAL | 0 refills | Status: AC

## 2023-03-07 NOTE — Patient Instructions (Addendum)
 Moderate Persistent Asthma with acute exacerbation  - If symptoms worsen, you will need to be evaluated in ER/urgent care.  - Start prednisone  40mg  daily x2 days, 30mg  x2 days, 20mg  x2 days, 10mg  x1 day.   - Restart Symbicort  160-2 puffs twice a day with a spacer to prevent cough or wheeze - Rescue inhaler: Albuterol  2 puffs via spacer or 1 vial via nebulizer every 4-6 hours as needed for respiratory symptoms of cough, shortness of breath, or wheezing Asthma control goals:  Full participation in all desired activities (may need albuterol  before activity) Albuterol  use two times or less a week on average (not counting use with activity) Cough interfering with sleep two times or less a month Oral steroids no more than once a year No hospitalizations  Concern for Post Viral Infection  - Start doxycycline  100mg  twice daily for 7 days. Take with foods and eat yogurt/probiotic with antibiotics.   Allergic rhinitis - Continue allergen avoidance measures directed toward grass pollen, tree pollen, and dust mites as listed below - Consider saline nasal rinses as needed for nasal symptoms. Use this before any medicated nasal sprays for best result - Use Flonase  2 sprays each nostril daily. - Use Zyrtec  10mg  daily as needed for runny nose, sneezing, itchy watery eyes.

## 2023-03-07 NOTE — Telephone Encounter (Signed)
 I called patient and informed of Dr.Patel's message. Patient said that pharmacy will not have the Symbicort  until Thursday. I informed I could send it to a different pharmacy to see if it is in stock. Patient said she would wait as she gets her medications through workers comp. Patient said she has been using her Symbicort  and believes it expired in 2022. Patient was speaking clear but I could hear her short of breath. I advised if sx get any worse and feels no relief to go to the ED. Patient is very thankful for Dr.Patel. Routing message to provider as an update.
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
## 2023-03-07 NOTE — Telephone Encounter (Signed)
 Patient called stating she was prescribed Prednisone  at 40 mg from another office. Patient states she was then prescribed prednisone  from our office. Patient states she took 40 mg this morning and last night so the patient is wanting to know if she should start taking the prednisone  that was prescribed by our office today.

## 2023-03-08 NOTE — Telephone Encounter (Signed)
 Pt called on call for continued symptoms.  She states her breathing is no better and she is having diarrhea related to doxycycline  use.  Pt was audibly SOB and not able to talk in complete sentences.  She stated albuterol  use is not lasting but minutes after use.  With how SOB she sounded over the phone I advised her she needed to go to the closest ED for eval and treatment.  She voiced there was someone that can take her to the ED.  She voiced she would go now.

## 2023-03-09 NOTE — Telephone Encounter (Signed)
 I called the patient to follow up on her ED visit and Asthma symptoms I left a message for the patient to call the [place] office back.

## 2023-03-09 NOTE — Telephone Encounter (Signed)
 Spoke with patient, she stated she doesn't feel any better. She is still audibly SOB, having hard time completing sentences and coughing. Patient stated that her Xray and all come back negative. She stated she asked them not to send her back home while she is experiencing her symptoms but she was informed that was nothing else that could be done. Patient is wondering what else can be done. She stopped taking the doxycycline  per what she was informed however she stated that if it will help her she will continue taking it? Patient also stated that the ED physician sent in some cough medication however she doesn't want to pick it up if it isn't going to help. She doesn't want medicine to stop her from coughing stuff up and getting it out. Please advise.

## 2023-03-09 NOTE — Telephone Encounter (Signed)
 Spoke with patient, informed her of Dr. Randell Patient recommendation. Patient verbalized understanding and stated she is sending her husband to pick up the cough medication now.

## 2023-03-10 NOTE — Telephone Encounter (Signed)
 Spoke with patient and informed her of Recommendations. Patient verbalized understanding and was appreciative for the help.

## 2023-03-13 ENCOUNTER — Telehealth: Payer: Self-pay | Admitting: Internal Medicine

## 2023-03-13 NOTE — Telephone Encounter (Signed)
 Patient called stating she is having labored breathing she had went to the ER asking the nurse to call for advise wanted to know if taking at least 5 days of antibiotics would be ok

## 2023-03-13 NOTE — Telephone Encounter (Signed)
 Called patient back - DOB verified - advised the following:  You were seen and treated at [facility] Emergency Department (ED) . While here you had an exam, labwork, and imaging and were not found to have any acute life or health threatening medical problems requiring hospital admission or further workup/interventions in the Emergency Department. Final reads on any imaging done today should be available on [facility] MyChart in the next 24 hours. You had a CT scan of your chest and a chest x-ray. Both of these were reassuring and did not show signs of an obvious pneumonia or blood clot. Your symptoms are likely from your recent influenza infection and may take some time to get better.   Your labs showed a mild elevation in your liver tests that you should have repeated with your primary care doctor  Continue using your inhaler, nebulizer treatments, prednisone . You can stop the doxycycline . A prescription for cough medication is sent to your pharmacy. Please follow-up with your primary care doctor in 5 to 7 days  Please return to the emergency department if you experience any new, worsening or concerning symptoms -clued a severe worsening shortness of breath, worsening chest pain, persistent fevers, nausea, vomiting with inability to keep anything down   Patient stated she is doing a little better  - she can see a light.  Patient advised not to take antibiotic per ER provider and [facility] provider office notes - reviewed nebulizer instruction with patient as well.  Patient verbalized understanding to all, no further questions.

## 2023-04-04 ENCOUNTER — Other Ambulatory Visit: Payer: Self-pay

## 2023-04-04 ENCOUNTER — Ambulatory Visit (INDEPENDENT_AMBULATORY_CARE_PROVIDER_SITE_OTHER): Admitting: Allergy & Immunology

## 2023-04-04 ENCOUNTER — Encounter: Payer: Self-pay | Admitting: Allergy & Immunology

## 2023-04-04 VITALS — BP 130/72 | HR 85 | Temp 97.9°F | Resp 12 | Ht 59.45 in | Wt 220.1 lb

## 2023-04-04 DIAGNOSIS — J3089 Other allergic rhinitis: Secondary | ICD-10-CM | POA: Diagnosis not present

## 2023-04-04 DIAGNOSIS — J4540 Moderate persistent asthma, uncomplicated: Secondary | ICD-10-CM | POA: Diagnosis not present

## 2023-04-04 DIAGNOSIS — J302 Other seasonal allergic rhinitis: Secondary | ICD-10-CM

## 2023-04-04 DIAGNOSIS — K219 Gastro-esophageal reflux disease without esophagitis: Secondary | ICD-10-CM

## 2023-04-04 DIAGNOSIS — R490 Dysphonia: Secondary | ICD-10-CM | POA: Diagnosis not present

## 2023-04-04 MED ORDER — OMEPRAZOLE 20 MG PO CPDR: 20.0000 mg | DELAYED_RELEASE_CAPSULE | Freq: Two times a day (BID) | ORAL | 5 refills | Status: AC

## 2023-04-04 MED ORDER — SUCRALFATE 1 G PO TABS
1.0000 g | ORAL_TABLET | Freq: Three times a day (TID) | ORAL | 0 refills | Status: AC
Start: 2023-04-04 — End: 2023-10-05

## 2023-04-04 MED ORDER — ALBUTEROL SULFATE (2.5 MG/3ML) 0.083% IN NEBU: 2.5000 mg | INHALATION_SOLUTION | RESPIRATORY_TRACT | 1 refills | Status: DC | PRN

## 2023-04-04 NOTE — Patient Instructions (Addendum)
1. Moderate persistent asthma without complication - Lung testing looked completely normal today (all over 100%).  - I think that a lot of this hoarseness is from irritation from GERD triggered by all of the coughing.  - We are going to put you on an aggressive treatment for GERD for one month.  - Restart the Symbicort and use this twice daily EVERY DAY.  - Call us if there are coverage issues.  - We are refilling your albuterol neb solution.  - Daily controller medication(s): Symbicort 160/4.83mcg two puffs twice daily with spacer - Prior to physical activity: albuterol 2 puffs 10-15 minutes before physical activity. - Rescue medications: albuterol 4 puffs every 4-6 hours as needed or albuterol nebulizer one vial every 4-6 hours as needed - Asthma control goals:   * Full participation in all desired activities (may need albuterol before activity) * Albuterol use two time or less a week on average (not counting use with activity) * Cough interfering with sleep two time or less a month * Oral steroids no more than once a year * No hospitalizations  2. Chronic rhinitis - Continue with Flonase one spray per nostril up to twice daily. - Continue with Zyrtec (cetirizine) 10mg  daily.    3. GERD - Use the omeprazole TWICE DAILY EVERY DAY for one month. - Add on Carafate 1 gm three times daily for one month.  - Reflux can be "silent" in that you don't feel reflux but this can cause the throat clearing and hoarseness. - Call us in one week with an update.   4. Return in about 6 months (around 10/02/2023). You can have the follow up appointment with Dr. Dellis Anes or a Nurse Practicioner (our Nurse Practitioners are excellent and always have Physician oversight!).    Please inform us of any Emergency Department visits, hospitalizations, or changes in symptoms. Call us before going to the ED for breathing or allergy symptoms since we might be able to fit you in for a sick visit. Feel free to contact  us anytime with any questions, problems, or concerns.  It was a pleasure to see you again today!  Websites that have reliable patient information: 1. American Academy of Asthma, Allergy, and Immunology: www.aaaai.org 2. Food Allergy Research and Education (FARE): foodallergy.org 3. Mothers of Asthmatics: http://www.asthmacommunitynetwork.org 4. American College of Allergy, Asthma, and Immunology: www.acaai.org      "Like" [place] on Facebook and Instagram for our latest updates!      A healthy democracy works best when [employer] participate! Make sure you are registered to vote! If you have moved or changed any of your contact information, you will need to get this updated before voting! Scan the QR codes below to learn more!

## 2023-04-04 NOTE — Progress Notes (Signed)
FOLLOW UP  Date of Service/Encounter:  04/04/23   Assessment:   Moderate persistent asthma without complication - with intermittent compliance   Seasonal and perennial allergic rhinitis   Gastroesophageal reflux disease - worsened with recent viral URI  Plan/Recommendations:   1. Moderate persistent asthma without complication - Lung testing looked completely normal today (all over 100%).  - I think that a lot of this hoarseness is from irritation from GERD triggered by all of the coughing.  - We are going to put you on an aggressive treatment for GERD for one month.  - Restart the Symbicort and use this twice daily EVERY DAY.  - Call us if there are coverage issues.  - We are refilling your albuterol neb solution.  - Daily controller medication(s): Symbicort 160/4.33mcg two puffs twice daily with spacer - Prior to physical activity: albuterol 2 puffs 10-15 minutes before physical activity. - Rescue medications: albuterol 4 puffs every 4-6 hours as needed or albuterol nebulizer one vial every 4-6 hours as needed - Asthma control goals:   * Full participation in all desired activities (may need albuterol before activity) * Albuterol use two time or less a week on average (not counting use with activity) * Cough interfering with sleep two time or less a month * Oral steroids no more than once a year * No hospitalizations  2. Chronic rhinitis - Continue with Flonase one spray per nostril up to twice daily. - Continue with Zyrtec (cetirizine) 10mg  daily.    3. GERD - Use the omeprazole TWICE DAILY EVERY DAY for one month. - Add on Carafate 1 gm three times daily for one month.  - Reflux can be "silent" in that you don't feel reflux but this can cause the throat clearing and hoarseness. - Call us in one week with an update.   4. Return in about 6 months (around 10/02/2023). You can have the follow up appointment with Dr. Dellis Anes or a Nurse Practicioner (our Nurse  Practitioners are excellent and always have Physician oversight!).   Subjective:   Brianna Nunez is a 63 y.o. female presenting today for follow up of  Chief Complaint  Patient presents with   Asthma    Brianna Nunez has a history of the following: Patient Active Problem List   Diagnosis Date Noted   Seasonal and perennial allergic rhinitis 10/01/2020   Epistaxis 10/01/2020   Gastroesophageal reflux disease without esophagitis 08/02/2016   Moderate persistent asthma without complication 07/29/2015   Seasonal allergic rhinitis due to pollen 07/29/2015   OSA (obstructive sleep apnea) 04/03/2014   Dysuria 04/03/2014   Screening for colon cancer 11/28/2013   Hyperlipidemia 10/09/2013   Morbid obesity (HCC) 10/09/2013   Arthralgia of hip 06/30/2011    History obtained from: chart review and patient.  Discussed the use of AI scribe software for clinical note transcription with the patient and/or guardian, who gave verbal consent to proceed.  Brianna Nunez is a 63 y.o. female presenting for a sick visit.  She was last seen in December 2024.  At that time, she was started on a prednisone taper.  She was restarted on her Symbicort 160 mcg 2 puffs twice daily as well as albuterol as needed.  She was also started on a course of doxycycline 100 mg twice a day for 7 days.  For her allergic rhinitis, she was continued on Flonase and Zyrtec.  Since last visit, she has remained about the same.  She tells me that she for  started to feel sick at the end of December, shortly before seeing Dr. Allena Katz.  She did take the prednisone and the cough medicine with codeine.  This helped a little bit.  She has continued to have a cough.  She was diagnosed with the flu but did not take Tamiflu initially at all because she has a allergy to this medication.  She has had a lot of postnasal drip and sinus pain and pressure.    She did have a chest x-ray and a chest CT during this time through [facility] that was  mostly normal.  Review of the reading showed that there was no evidence of an acute pulmonary embolism.  She did have a dilated pulmonary artery raising the possibility of pulmonary hypertension.  Chest x-ray was completely normal.  She remains on Symbicort but does need a refill of that today.  She reports compliance, although in the past she has not been entirely compliant.  She does need more albuterol nebulizer solution.  This has been keeping her out of the emergency room.  She does report that she has had more appreciable reflux in the last month or 2.  She feels that a lot of her food is coming up.  She has felt this on a couple of occasions.  She does have omeprazole, but only takes it once a day if that.  She sometimes will not even use it that often.  Otherwise, there have been no changes to her past medical history, surgical history, family history, or social history.    Review of systems otherwise negative other than that mentioned in the HPI.    Objective:   Blood pressure 130/72, pulse 85, temperature 97.9 F (36.6 C), temperature source Temporal, resp. rate 12, height 4' 11.45" (1.51 m), weight 220 lb 1.6 oz (99.8 kg), SpO2 95%. Body mass index is 43.79 kg/m.    Physical Exam Vitals reviewed.  Constitutional:      Appearance: She is well-developed.     Comments: Hard of hearing. Hoarser.  HENT:     Head: Normocephalic and atraumatic.     Right Ear: Tympanic membrane, ear canal and external ear normal.     Left Ear: Tympanic membrane, ear canal and external ear normal.     Nose: No nasal deformity, septal deviation, mucosal edema or rhinorrhea.     Right Turbinates: Enlarged, swollen and pale.     Left Turbinates: Enlarged, swollen and pale.     Right Sinus: No maxillary sinus tenderness or frontal sinus tenderness.     Left Sinus: No maxillary sinus tenderness or frontal sinus tenderness.     Mouth/Throat:     Mouth: Mucous membranes are not pale and not dry.      Pharynx: Uvula midline.  Eyes:     General: Lids are normal. No allergic shiner.       Right eye: No discharge.        Left eye: No discharge.     Conjunctiva/sclera: Conjunctivae normal.     Right eye: Right conjunctiva is not injected. No chemosis.    Left eye: Left conjunctiva is not injected. No chemosis.    Pupils: Pupils are equal, round, and reactive to light.  Cardiovascular:     Rate and Rhythm: Normal rate and regular rhythm.     Heart sounds: Normal heart sounds.  Pulmonary:     Effort: Pulmonary effort is normal. No tachypnea, accessory muscle usage or respiratory distress.     Breath sounds:  Normal breath sounds. No wheezing, rhonchi or rales.  Chest:     Chest wall: No tenderness.  Lymphadenopathy:     Cervical: No cervical adenopathy.  Skin:    Coloration: Skin is not pale.     Findings: No abrasion, erythema, petechiae or rash. Rash is not papular, urticarial or vesicular.  Neurological:     Mental Status: She is alert.  Psychiatric:        Behavior: Behavior is cooperative.      Diagnostic studies:    Spirometry: results normal (FEV1: 2.17/109%, FVC: 2.52/100%, FEV1/FVC: 86%).    Spirometry consistent with normal pattern.   Allergy Studies: none       Malachi Bonds, MD  [facility]

## 2023-04-14 ENCOUNTER — Telehealth: Payer: Self-pay | Admitting: Allergy & Immunology

## 2023-04-14 DIAGNOSIS — J329 Chronic sinusitis, unspecified: Secondary | ICD-10-CM

## 2023-04-14 NOTE — Telephone Encounter (Signed)
 Please advise, I did not see any mention of a referral to ENT in your previous office note.

## 2023-04-14 NOTE — Telephone Encounter (Signed)
 Pt request a call back about a referral to an EN&T

## 2023-04-18 ENCOUNTER — Telehealth: Payer: Self-pay | Admitting: Allergy & Immunology

## 2023-04-18 NOTE — Telephone Encounter (Signed)
Patient asking for a referral to an ENT please send to Dr.Hacham  At [facility] number [phone].

## 2023-05-04 NOTE — Telephone Encounter (Signed)
 Patient went to another ent. [facility] referral closed out.

## 2023-07-10 ENCOUNTER — Other Ambulatory Visit: Payer: Self-pay

## 2023-07-10 DIAGNOSIS — G8929 Other chronic pain: Secondary | ICD-10-CM

## 2023-07-10 DIAGNOSIS — Z8241 Family history of sudden cardiac death: Secondary | ICD-10-CM

## 2023-07-21 ENCOUNTER — Ambulatory Visit
Admission: RE | Admit: 2023-07-21 | Discharge: 2023-07-21 | Disposition: A | Discharge status: Home / Self Care | Payer: Self-pay | Source: Ambulatory Visit

## 2023-07-21 DIAGNOSIS — M25511 Pain in right shoulder: Secondary | ICD-10-CM | POA: Insufficient documentation

## 2023-07-21 DIAGNOSIS — Z8241 Family history of sudden cardiac death: Secondary | ICD-10-CM | POA: Insufficient documentation

## 2023-07-21 DIAGNOSIS — G8929 Other chronic pain: Secondary | ICD-10-CM | POA: Insufficient documentation

## 2023-09-07 ENCOUNTER — Telehealth: Payer: Self-pay | Admitting: Allergy & Immunology

## 2023-09-07 NOTE — Telephone Encounter (Signed)
 Spoke to the patient and Rhinase was the sample the patient received in office. She found it over the counter but due to cost she bought ayr nasal gel. She thinks the irritation was cause by the rinses she was doing which was an aerosol nasal saline spray. She is going to take a break and treat it with the gel and take her zyrtec  as needed for allergy symptoms.   - patient will call back if that does not help and plans to keep her 10/05/23 appt. with Dr. Iva.

## 2023-09-07 NOTE — Telephone Encounter (Signed)
 Thanks, Juline Patch!   Malachi Bonds, MD [facility]

## 2023-09-07 NOTE — Telephone Encounter (Signed)
 I called the patient back regarding the cream she would like us  to send in for dry nose. I left a message for her to call the office back.

## 2023-09-07 NOTE — Telephone Encounter (Signed)
 Brianna Nunez called and stated that she is having an issue with the inside of her nose being really raw and dry. She states she has been using Vaseline but it is not getting any better. She states that at one point she was given a sample of some kind of ointment to help with this, and is asking if some could be prescribed to her. She did not know the name. She uses the [facility] in [place]. Best number to contact [phone]

## 2023-09-19 ENCOUNTER — Ambulatory Visit: Payer: Self-pay | Admitting: Allergy & Immunology

## 2023-10-05 ENCOUNTER — Other Ambulatory Visit: Payer: Self-pay

## 2023-10-05 ENCOUNTER — Ambulatory Visit (INDEPENDENT_AMBULATORY_CARE_PROVIDER_SITE_OTHER): Payer: Self-pay | Admitting: Allergy & Immunology

## 2023-10-05 ENCOUNTER — Encounter: Payer: Self-pay | Admitting: Allergy & Immunology

## 2023-10-05 VITALS — BP 126/76 | HR 80 | Temp 97.1°F | Resp 20

## 2023-10-05 DIAGNOSIS — J4540 Moderate persistent asthma, uncomplicated: Secondary | ICD-10-CM

## 2023-10-05 DIAGNOSIS — K219 Gastro-esophageal reflux disease without esophagitis: Secondary | ICD-10-CM | POA: Diagnosis not present

## 2023-10-05 DIAGNOSIS — J3089 Other allergic rhinitis: Secondary | ICD-10-CM

## 2023-10-05 DIAGNOSIS — J302 Other seasonal allergic rhinitis: Secondary | ICD-10-CM

## 2023-10-05 DIAGNOSIS — R490 Dysphonia: Secondary | ICD-10-CM | POA: Diagnosis not present

## 2023-10-05 MED ORDER — ALBUTEROL SULFATE HFA 108 (90 BASE) MCG/ACT IN AERS: 2.0000 | INHALATION_SPRAY | RESPIRATORY_TRACT | 1 refills | Status: AC | PRN

## 2023-10-05 MED ORDER — ALBUTEROL SULFATE (2.5 MG/3ML) 0.083% IN NEBU: 2.5000 mg | INHALATION_SOLUTION | RESPIRATORY_TRACT | 1 refills | Status: AC | PRN

## 2023-10-05 MED ORDER — BUDESONIDE-FORMOTEROL FUMARATE 160-4.5 MCG/ACT IN AERO: 2.0000 | INHALATION_SPRAY | Freq: Two times a day (BID) | RESPIRATORY_TRACT | 5 refills | Status: AC

## 2023-10-05 NOTE — Progress Notes (Signed)
 FOLLOW UP  Date of Service/Encounter:  10/05/23   Assessment:   Moderate persistent asthma without complication - with intermittent compliance   Seasonal and perennial allergic rhinitis   Gastroesophageal reflux disease - worsened with recent viral URI    Plan/Recommendations:   1. Moderate persistent asthma without complication - Lung testing looked completely normal today (all over 100%).  - Consider doing the Symbicort  at least once daily to keep airway inflammation under good control.  - Daily controller medication(s): Symbicort  160/4.49mcg one to two puffs AT LEAST once daily with spacer - Prior to physical activity: albuterol  2 puffs 10-15 minutes before physical activity. - Rescue medications: albuterol  4 puffs every 4-6 hours as needed or albuterol  nebulizer one vial every 4-6 hours as needed - Asthma control goals:   * Full participation in all desired activities (may need albuterol  before activity) * Albuterol  use two time or less a week on average (not counting use with activity) * Cough interfering with sleep two time or less a month * Oral steroids no more than once a year * No hospitalizations  2. Chronic rhinitis - Continue with Flonase  one spray per nostril up to twice daily. - Continue with Zyrtec  (cetirizine ) 10mg  daily.    3. GERD - Use the omeprazole  once daily (can increase to twice daily during bad times of the year).   4. Return in about 6 months (around 04/06/2024). You can have the follow up appointment with Dr. Iva or a Nurse Practicioner (our Nurse Practitioners are excellent and always have Physician oversight!).   Subjective:   Brianna Nunez is a 63 y.o. female presenting today for follow up of  Chief Complaint  Patient presents with   Asthma    Doing much better since January. Using inhalers seldom since throat has been irritated.     Allergic Rhinitis      Doing good.     Brianna Nunez has a history of the  following: Patient Active Problem List   Diagnosis Date Noted   Seasonal and perennial allergic rhinitis 10/01/2020   Epistaxis 10/01/2020   Gastroesophageal reflux disease without esophagitis 08/02/2016   Moderate persistent asthma without complication 07/29/2015   Seasonal allergic rhinitis due to pollen 07/29/2015   OSA (obstructive sleep apnea) 04/03/2014   Dysuria 04/03/2014   Screening for colon cancer 11/28/2013   Hyperlipidemia 10/09/2013   Morbid obesity (HCC) 10/09/2013   Arthralgia of hip 06/30/2011    History obtained from: chart review and patient.  Discussed the use of AI scribe software for clinical note transcription with the patient and/or guardian, who gave verbal consent to proceed.  Brianna Nunez is a 63 y.o. female presenting for a follow up visit.  She was last seen in January 2025.  At that time, lung testing looked completely normal and overall 100%.  She was having a lot of hoarseness that I thought was irritation from GERD.  We restarted her Symbicort  and recommended using this twice daily every day.  We also refilled her albuterol  neb solution.  For her rhinitis, we continue with Flonase  as well as Zyrtec .  We also continue with omeprazole  twice daily every day for a month and added on Carafate  1 g 3 times daily.  Since last visit, she has had some interesting developments.  She has experienced persistent hoarseness since January following a severe upper respiratory infection that required an ER visit on New Year's Day. Her voice has remained hoarse throughout the year. 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  Is not clear whether she is using her Symbicort  on a routine basis, and it seems that she is at least using it once a day.  She has not been on prednisone  in a while.  Spirometry looks beautiful today.  In January, she had a severe respiratory illness that led to difficulty  breathing and an ER visit. She describes the episode as life-threatening. Since then, her breathing has been well-managed, and her asthma is stable with the use of Symbicort  as needed. She uses a nebulizer for respiratory issues and reports improvement after adjusting her technique.  She has a history of gastrointestinal issues and was taking Carafate  for stomach problems but discontinued it due to difficulty swallowing. She continues to take an acid pill as needed for reflux symptoms, which she believes may be related to silent reflux. A recent visit to a nutritionist suggested silent reflux, and a test for sticky plaque related to cholesterol was ordered.  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 She is concerned about her cholesterol levels, which have increased over the past year, and considers the impact of family history and stress on her health.  Her family history includes her brother, who died at 54 from a second heart attack, and her husband, who has a history of seizures and memory issues. She describes a stressful year, including the death of her brother and her husband's health issues, which she believes has contributed to her health problems. She identifies as a stress eater, which she feels has impacted her cholesterol levels.  Otherwise, there have been no changes to her past medical history, surgical history, family history, or social history.    Review of systems otherwise negative other than that mentioned in the HPI.    Objective:   Blood pressure 126/76, pulse 80, temperature (!) 97.1 F (36.2 C), resp. rate 20, SpO2 96%. There is no height or weight on file to calculate BMI.    Physical Exam Vitals reviewed.  Constitutional:      Appearance: She is well-developed.     Comments: Hard of hearing. Not as hoarse today.   HENT:     Head: Normocephalic  and atraumatic.     Right Ear: Tympanic membrane, ear canal and external ear normal.     Left Ear: Tympanic membrane, ear canal and external ear normal.     Nose: No nasal deformity, septal deviation, mucosal edema or rhinorrhea.     Right Turbinates: Enlarged, swollen and pale.     Left Turbinates: Enlarged, swollen and pale.     Right Sinus: No maxillary sinus tenderness or frontal sinus tenderness.     Left Sinus: No maxillary sinus tenderness or frontal sinus tenderness.     Comments: No polyps noted.     Mouth/Throat:     Mouth: Mucous membranes are not pale and not dry.     Pharynx: Uvula midline.  Eyes:     General: Lids are normal. Allergic shiner present.        Right eye: No discharge.        Left eye: No discharge.     Conjunctiva/sclera: Conjunctivae normal.     Right eye: Right conjunctiva is not injected. No chemosis.    Left eye: Left conjunctiva is not  injected. No chemosis.    Pupils: Pupils are equal, round, and reactive to light.  Cardiovascular:     Rate and Rhythm: Normal rate and regular rhythm.     Heart sounds: Normal heart sounds.  Pulmonary:     Effort: Pulmonary effort is normal. No tachypnea, accessory muscle usage or respiratory distress.     Breath sounds: Normal breath sounds. No wheezing, rhonchi or rales.     Comments: Moving air well in all lung fields. No increased work of breathing noted.  Chest:     Chest wall: No tenderness.  Lymphadenopathy:     Cervical: No cervical adenopathy.  Skin:    Coloration: Skin is not pale.     Findings: No abrasion, erythema, petechiae or rash. Rash is not papular, urticarial or vesicular.  Neurological:     Mental Status: She is alert.  Psychiatric:        Behavior: Behavior is cooperative.      Diagnostic studies:    Spirometry: results normal (FEV1: 2.03/103%, FVC: 2.66/106%, FEV1/FVC: 76%).    Spirometry consistent with normal pattern.   Allergy Studies: none       Marty Shaggy, MD  [facility] 

## 2023-10-05 NOTE — Patient Instructions (Addendum)
 1. Moderate persistent asthma without complication - Lung testing looked completely normal today (all over 100%).  - Consider doing the Symbicort  at least once daily to keep airway inflammation under good control.  - Daily controller medication(s): Symbicort  160/4.19mcg one to two puffs AT LEAST once daily with spacer - Prior to physical activity: albuterol  2 puffs 10-15 minutes before physical activity. - Rescue medications: albuterol  4 puffs every 4-6 hours as needed or albuterol  nebulizer one vial every 4-6 hours as needed - Asthma control goals:   * Full participation in all desired activities (may need albuterol  before activity) * Albuterol  use two time or less a week on average (not counting use with activity) * Cough interfering with sleep two time or less a month * Oral steroids no more than once a year * No hospitalizations  2. Chronic rhinitis - Continue with Flonase  one spray per nostril up to twice daily. - Continue with Zyrtec  (cetirizine ) 10mg  daily.    3. GERD - Use the omeprazole  once daily (can increase to twice daily during bad times of the year).   4. Return in about 6 months (around 04/06/2024). You can have the follow up appointment with Dr. Iva or a Nurse Practicioner (our Nurse Practitioners are excellent and always have Physician oversight!).    Please inform us  of any Emergency Department visits, hospitalizations, or changes in symptoms. Call us  before going to the ED for breathing or allergy symptoms since we might be able to fit you in for a sick visit. Feel free to contact us  anytime with any questions, problems, or concerns.  It was a pleasure to see you again today!  Websites that have reliable patient information: 1. American Academy of Asthma, Allergy, and Immunology: www.aaaai.org 2. Food Allergy Research and Education (FARE): foodallergy.org 3. Mothers of Asthmatics: http://www.asthmacommunitynetwork.org 4. American College of Allergy, Asthma, and  Immunology: www.acaai.org      "Like" us  on Facebook and Instagram for our latest updates!      A healthy democracy works best when [employer] participate! Make sure you are registered to vote! If you have moved or changed any of your contact information, you will need to get this updated before voting! Scan the QR codes below to learn more!

## 2023-10-10 ENCOUNTER — Telehealth: Payer: Self-pay | Admitting: Allergy & Immunology

## 2023-10-10 NOTE — Telephone Encounter (Signed)
 Brianna Nunez called and stated that she would like to try Singulair  again, as she thinks it would help her at night. She states she had gone off it, but wants to have it sent in for her again. She states this is something her and Dr. Iva discussed at her last appt. Best contact [phone]

## 2023-10-13 NOTE — Telephone Encounter (Signed)
 I am fine with her restarting that. Does she need a script?   Marty Shaggy, MD [facility]  

## 2023-10-16 MED ORDER — MONTELUKAST SODIUM 10 MG PO TABS: 10.0000 mg | ORAL_TABLET | Freq: Every day | ORAL | 5 refills | Status: AC

## 2023-10-16 NOTE — Telephone Encounter (Signed)
 I called the patient and she said her singulair  rx is out of date. A new rx has been sent in.

## 2023-10-25 NOTE — Progress Notes (Signed)
 Referring Provider:   Carrico, Jeni Lyn, [address] [place],  [place] [address]  Chief Complaint:   Hoarseness  Subjective:     Brianna Nunez is a 63 y.o. female who is seen for the above. Voice has been up and down. Rates the voice a 7-8/10. No throat pain. She isn't feeling reflux symptoms. Feeling some mucus in the throat she needs to clear. Her swallowing is good. She had increased her water intake but has slacked off this past week. Has not been doing HEP.  Presenting HPI:  Allergy note from Dr. Iva reviewed: normal PFTs, treating cough/hoarseness with PPI bid. Was treated 02/2023 with doxycycline , prednisone , restart symbicort  for asthma flare. Was dx with flu 02/27/23 and was rx azithromycin and tussionex. Voice changed in January in the setting of harsh cough. Voice has stayed the same. Voicing is effortful and hoarse. Can fatigue with use. No odynophonia. Not a singer. Rates voice 3-4/10. Throat clears regularly. Cough is mostly gone.  Doesn't gargle after using symbicort , does rinse.  OSA with overall AHI 24, supine AHI 60 01/2016 study. It is not treated.  Water 30 ounces, coffee 0 ounces, tea 16-20 ounces, soda 24 ounces per day. She uses Ricola lozenges. She can have pills and solids stick in the low throat. Rarely gets choked on water. Prior ear procedures. Past Medical History, Surgical History, Medications, Allergies, Social History:   Past Medical History:  Diagnosis Date  . Arthritis   . Asthma without status asthmaticus (HHS-HCC)   . GERD (gastroesophageal reflux disease)   . Hearing loss   . Motion sickness   . Osteoarthritis   . PONV (postoperative nausea and vomiting)   . Sore throat, unspecified    Family History  Problem Relation Name Age of Onset  . Myocardial Infarction (Heart attack) Mother  65  . Coronary Artery Disease (Blocked arteries around heart) Mother    . Heart disease Father    . Prostate cancer Father     . Diabetes Father    . Lymphoma Father    . Coronary Artery Disease (Blocked arteries around heart) Brother    . Diabetes type II Brother    . Heart disease Other Family   . Stroke Other Family   . Anesthesia problems Neg Hx    . Glaucoma Neg Hx    . Macular degeneration Neg Hx    . Breast cancer Neg Hx    . Ovarian cancer Neg Hx     Social History   Socioeconomic History  . Marital status: Married  . Number of children: 1  Tobacco Use  . Smoking status: Never  . Smokeless tobacco: Never  Vaping Use  . Vaping status: Never Used  Substance and Sexual Activity  . Alcohol use: Not Currently    Comment: Rarely  . Drug use: No  . Sexual activity: Defer  Social History Narrative   Married. Currently unemployed, previously worked in Fore[employer].    Social Drivers of Health   Financial Resource Strain: Medium Risk (08/29/2023)   Overall Financial Resource Strain (CARDIA)   . Difficulty of Paying Living Expenses: Somewhat hard  Food Insecurity: Food Insecurity Present (08/29/2023)   Hunger Vital Sign   . Worried About Programme Researcher, Broadcasting/film/video in the Last Year: Sometimes true   . Ran Out of Food in the Last Year: Sometimes true  Transportation Needs: No Transportation Needs (08/29/2023)   PRAPARE - Transportation   . Lack of Transportation (Medical):  No   . Lack of Transportation (Non-Medical): No  Housing Stability: Low Risk  (08/29/2023)   Housing Stability Vital Sign   . Unable to Pay for Housing in the Last Year: No   . Number of Times Moved in the Last Year: 0   . Homeless in the Last Year: No   has a current medication list which includes the following prescription(s): albuterol  mdi (proventil , ventolin , proair ) hfa, budesonide -formoterol , clobetasol, fluticasone  propionate, ibuprofen, ipratropium-albuterol , meloxicam, montelukast , nitroglycerin, omeprazole , peg-electrolyte, rosuvastatin, and scopolamine. Review of Systems  A full review of systems was obtained from the  patient and is documented in the nursing progress note. I have reviewed this information and discussed as appropriate with the patient; all other systems are negative.  Objective:   Vital Signs:  BP 132/68 (BP Location: Left upper arm, Patient Position: Sitting, BP Cuff Size: Large Adult)   Pulse 68   Temp 36.4 C (97.6 F) (Temporal)   Wt 100.7 kg (222 lb)   BMI 44.09 kg/m   PHYSICAL EXAMINATION:   General appearance: Alert, well appearing, and in no distress. No evidence of nutritional abnormality. Voice rough, strained. Frequent throat clearing. Breath holding with phonation.  Nose:  Patent nasal passages. No erythema, discharge, purulence, or polyps. Septum intact without significant deviation. External appearance symmetric without gross deformity.  Face: Normal symmetry. Normal bilateral facial motion and sensation. No cutaneous lesions evident. No facial mass, tenderness or swelling. No palpable abnormality of the parotid glands.  Oral Cavity: Normal appearing mucosa of tongue, floor of mouth, buccal mucosa, and palate. Teeth in good repair. Symmetric tongue mobility with normal protrusion.  Oropharynx: Mucous membranes moist, pharynx normal without lesions or asymmetry, tonsils normal.   Neck:  No significant palpable adenopathy. No visible or palpable neck masses. Thyroid is normal in size without nodules or tenderness.   Pulmonary: No stridor, wheezing, tachypnea. No suprasternal retractions evident. Harsh cough.  Musculoskeletal: No TMJ tenderness, deformity or swelling. No anterior cervical muscular tenderness noted.   Procedure:    Videostroboscopy was performed to further evaluate the patient's throat/voice complaint and was reviewed with the patient. Pertinent findings include:  Nasopharynx:  Clear. Minimal lymphoid studding is seen on the posterior pharyngeal wall Hypopharynx:  No masses or abnormalities.  Larynx: Exam interpretation VF appearance: no erythema,  bilateral edema VF Mobility: RIGHT Vocal fold- fully mobile. LEFT Vocal fold- fully mobile VF Atrophy: no vocal fold atrophy.  VF Vibration: occ aperiodic VF Wave: reduced wave bilaterally VF Amplitude: reduced amplitude bilaterally VF lesions: bilateral VF scar VF closure: complete Laryngeal compression: mild  No signs of reflux or post-cricoid edema.  Mild thick endolaryngeal mucous noted. The exam is reviewed with our Speech Language Pathologist and interpreted with the patient present. 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 She had ulcerative laryngitis following severe cough and the vocal folds are fully healed now. She will have permanent hoarseness due to the scars. Drink plenty of water, using HEP and don't throat clear. Return prn.   Attestation Statement:   I personally performed the service. (TP)  ALISSA ROSALINE COLLET, MD   Issues concerning treatment and diagnosis were discussed with the patient.  There  were no barriers to understanding the plan of treatment. Pros and cons were discussed of treatment options. Explanation was well received by patient, who then verbalized understanding.

## 2024-01-19 ENCOUNTER — Emergency Department: Payer: Self-pay

## 2024-01-19 ENCOUNTER — Other Ambulatory Visit: Payer: Self-pay

## 2024-01-19 ENCOUNTER — Emergency Department
Admission: EM | Admit: 2024-01-19 | Discharge: 2024-01-19 | Disposition: A | Discharge status: Home / Self Care | Payer: Self-pay | Attending: Emergency Medicine | Admitting: Emergency Medicine

## 2024-01-19 DIAGNOSIS — R0600 Dyspnea, unspecified: Secondary | ICD-10-CM

## 2024-01-19 DIAGNOSIS — J441 Chronic obstructive pulmonary disease with (acute) exacerbation: Secondary | ICD-10-CM | POA: Diagnosis not present

## 2024-01-19 DIAGNOSIS — I509 Heart failure, unspecified: Secondary | ICD-10-CM | POA: Insufficient documentation

## 2024-01-19 DIAGNOSIS — I2510 Atherosclerotic heart disease of native coronary artery without angina pectoris: Secondary | ICD-10-CM | POA: Insufficient documentation

## 2024-01-19 DIAGNOSIS — R0602 Shortness of breath: Secondary | ICD-10-CM | POA: Diagnosis present

## 2024-01-19 DIAGNOSIS — R051 Acute cough: Secondary | ICD-10-CM | POA: Insufficient documentation

## 2024-01-19 LAB — CBC
HCT: 40.6 % (ref 36.0–46.0)
Hemoglobin: 13.5 g/dL (ref 12.0–15.0)
MCH: 30.9 pg (ref 26.0–34.0)
MCHC: 33.3 g/dL (ref 30.0–36.0)
MCV: 92.9 fL (ref 80.0–100.0)
Platelets: 123 K/uL — ABNORMAL LOW (ref 150–400)
RBC: 4.37 MIL/uL (ref 3.87–5.11)
RDW: 12.6 % (ref 11.5–15.5)
WBC: 8.3 K/uL (ref 4.0–10.5)
nRBC: 0 % (ref 0.0–0.2)

## 2024-01-19 LAB — PRO BRAIN NATRIURETIC PEPTIDE: Pro Brain Natriuretic Peptide: 142 pg/mL (ref <300.0)

## 2024-01-19 LAB — BASIC METABOLIC PANEL WITH GFR
Anion gap: 10 (ref 5–15)
BUN: 12 mg/dL (ref 8–23)
CO2: 27 mmol/L (ref 22–32)
Calcium: 9.1 mg/dL (ref 8.9–10.3)
Chloride: 100 mmol/L (ref 98–111)
Creatinine, Ser: 0.92 mg/dL (ref 0.44–1.00)
GFR, Estimated: >60 mL/min (ref >60)
Glucose, Bld: 137 mg/dL — ABNORMAL HIGH (ref 70–99)
Potassium: 3.8 mmol/L (ref 3.5–5.1)
Sodium: 137 mmol/L (ref 135–145)

## 2024-01-19 LAB — TROPONIN T, HIGH SENSITIVITY
Troponin T High Sensitivity: <15 ng/L (ref 0–19)
Troponin T High Sensitivity: <15 ng/L (ref 0–19)

## 2024-01-19 LAB — RESP PANEL BY RT-PCR (RSV, FLU A&B, COVID)  RVPGX2
Influenza A by PCR: NEGATIVE
Influenza B by PCR: NEGATIVE
Resp Syncytial Virus by PCR: NEGATIVE
SARS Coronavirus 2 by RT PCR: NEGATIVE

## 2024-01-19 LAB — GROUP A STREP BY PCR: Group A Strep by PCR: NOT DETECTED

## 2024-01-19 MED ORDER — SODIUM CHLORIDE 0.9 % IV SOLN
100.0000 mg | Freq: Once | INTRAVENOUS | Status: AC
  Administered 2024-01-19: 100 mg via INTRAVENOUS
  Filled 2024-01-19: qty 100

## 2024-01-19 MED ORDER — DOXYCYCLINE HYCLATE 100 MG PO TABS: 100.0000 mg | ORAL_TABLET | Freq: Two times a day (BID) | ORAL | 0 refills | Status: AC

## 2024-01-19 MED ORDER — PREDNISONE 20 MG PO TABS: 20.0000 mg | ORAL_TABLET | Freq: Every day | ORAL | 0 refills | Status: AC

## 2024-01-19 MED ORDER — IPRATROPIUM-ALBUTEROL 0.5-2.5 (3) MG/3ML IN SOLN
9.0000 mL | Freq: Once | RESPIRATORY_TRACT | Status: AC
  Administered 2024-01-19: 9 mL via RESPIRATORY_TRACT
  Filled 2024-01-19: qty 9

## 2024-01-19 MED ORDER — METHYLPREDNISOLONE SODIUM SUCC 125 MG IJ SOLR
125.0000 mg | Freq: Once | INTRAMUSCULAR | Status: AC
  Administered 2024-01-19: 125 mg via INTRAVENOUS
  Filled 2024-01-19: qty 2

## 2024-01-19 NOTE — Discharge Instructions (Addendum)
 Please ensure to follow-up with primary care doctor next week to get reassessed.  Take the antibiotics and steroids as prescribed for your COPD exacerbation.

## 2024-01-19 NOTE — ED Triage Notes (Signed)
 Pt to ED for c/o SOB, audible wheezing. O2 currently 100% on room air. Pt ambulatory to triage, speaking in full sentences.

## 2024-01-19 NOTE — ED Provider Notes (Signed)
 Brianna Nunez Provider Note    Event Date/Time   First MD Initiated Contact with Patient 01/19/24 1040     (approximate)   History   Shortness of Breath   HPI  Brianna Nunez is a 63 y.o. female COPD, CHF, hyperlipidemia, GERD, presenting with shortness of breath.  Says has been ongoing for several days, also sore throat and new cough.  No fever, no chest pain.  Does note that her legs are slightly more swollen than usual.  No sick contacts.  No abdominal pain, no other urinary symptoms or nausea vomiting diarrhea.  States that her hoarseness and voice change has been persistent since a prior URI episode.  On independent chart review, she was seen by primary care in October, has history of CAD and sciatica, has had respiratory issues in the past leading to ER visits.  Had complained of hoarseness that has persisted after a viral URI several weeks back.  Has been attending speech and voice therapy with ENT.     Physical Exam   Triage Vital Signs: ED Triage Vitals  Encounter Vitals Group     BP 01/19/24 1031 (!) 143/83     Girls Systolic BP Percentile --      Girls Diastolic BP Percentile --      Boys Systolic BP Percentile --      Boys Diastolic BP Percentile --      Pulse Rate 01/19/24 1031 (!) 116     Resp 01/19/24 1031 (!) 24     Temp 01/19/24 1031 98.1 F (36.7 C)     Temp Source 01/19/24 1031 Oral     SpO2 01/19/24 1031 100 %     Weight 01/19/24 1036 228 lb (103.4 kg)     Height 01/19/24 1036 4' 11 (1.499 m)     Head Circumference --      Peak Flow --      Pain Score 01/19/24 1032 0     Pain Loc --      Pain Education --      Exclude from Growth Chart --     Most recent vital signs: Vitals:   01/19/24 1031  BP: (!) 143/83  Pulse: (!) 116  Resp: (!) 24  Temp: 98.1 F (36.7 C)  SpO2: 100%     General: Awake, no distress.  CV:  Good peripheral perfusion.  Resp:  Normal effort.  Diffuse wheezing bilaterally, no  stridor Abd:  No distention.  Soft nontender Other:  Posterior oropharynx is mildly erythematous without swelling, trace lower extremity edema bilaterally, no unilateral calf swelling or tenderness   ED Results / Procedures / Treatments   Labs (all labs ordered are listed, but only abnormal results are displayed) Labs Reviewed  BASIC METABOLIC PANEL WITH GFR - Abnormal; Notable for the following components:      Result Value   Glucose, Bld 137 (*)    All other components within normal limits  CBC - Abnormal; Notable for the following components:   Platelets 123 (*)    All other components within normal limits  RESP PANEL BY RT-PCR (RSV, FLU A&B, COVID)  [id number]  GROUP A STREP BY PCR  PRO BRAIN NATRIURETIC PEPTIDE  TROPONIN T, HIGH SENSITIVITY  TROPONIN T, HIGH SENSITIVITY     EKG  EKG shows, sinus rhythm, rate of 97, normal QS, normal QTc, T wave flattening in aVL, no obvious ischemic ST elevation, not significantly changed compared to prior   RADIOLOGY  On my independent interpretation, chest x-ray without obvious consolidation   PROCEDURES:  Critical Care performed: No  Procedures   MEDICATIONS ORDERED IN ED: Medications  doxycycline  (VIBRAMYCIN ) 100 mg in sodium chloride 0.9 % 250 mL IVPB (100 mg Intravenous New Bag/Given 01/19/24 1151)  ipratropium-albuterol  (DUONEB) 0.5-2.5 (3) MG/3ML nebulizer solution 9 mL (9 mLs Nebulization Given 01/19/24 1055)  methylPREDNISolone sodium succinate (SOLU-MEDROL) 125 mg/2 mL injection 125 mg (125 mg Intravenous Given 01/19/24 1054)     IMPRESSION / MDM / ASSESSMENT AND PLAN / ED COURSE  I reviewed the triage vital signs and the nursing notes.                              Differential diagnosis includes, but is not limited to, viral illness, pharyngitis, pneumonia, COPD exacerbation, CHF.  Did consider PE but given the diffuse wheezing, favor COPD exacerbation at this time.  Labs, EKG, troponin, chest x-ray  Patient's  presentation is most consistent with acute presentation with potential threat to life or bodily function.  Independent interpretation of labs and imaging below.  Clinical course as below.  Given that symptoms have improved after DuoNebs, she is not hypoxic, not tachypneic, tachycardia is improving, considered but no indication for inpatient admission at this time, she is safe for outpatient management.  Discussed with patient about imaging and lab results include incidental findings.  Patient states that she is not followed by pulmonologist but with a regular doctor.  No need for refills for her inhalers at this time.  Did discuss with her about following up with primary care doctor next week to get reassessed, will give her prescriptions for steroids as well as antibiotics for COPD exacerbation.  Shared decision making done with patient and she is agreeable with this plan.  Discharged with strict return precautions.  The patient is on the cardiac monitor to evaluate for evidence of arrhythmia and/or significant heart rate changes.   Clinical Course as of 01/19/24 1327  Fri Jan 19, 2024  1215 Dependent review of labs, no leukocytosis, electrolytes not severely deranged, troponin and BNP are not elevated, group A strep is negative. [TT]  1215 DG Chest 2 View Streaky opacity in the retrocardiac left base suggests atelectasis.  [TT]  1308 On reassessment patient is feeling a lot better, lungs are clear now.  She is not hypoxic, not tachypneic. [TT]  1312 Troponin T, High Sensitivity Troponin x 2 is negative. [TT]    Clinical Course User Index [TT] Waymond Lorelle Cummins, MD     FINAL CLINICAL IMPRESSION(S) / ED DIAGNOSES   Final diagnoses:  COPD exacerbation (HCC)  Dyspnea, unspecified type  Acute cough     Rx / DC Orders   ED Discharge Orders          Ordered    predniSONE  (DELTASONE ) 20 MG tablet  Daily with breakfast        01/19/24 1325    doxycycline  (VIBRA -TABS) 100 MG tablet  2 times  daily        01/19/24 1325             Note:  This document was prepared using Dragon voice recognition software and may include unintentional dictation errors.     Waymond Lorelle Cummins, MD 01/19/24 [phone]

## 2024-01-19 NOTE — ED Notes (Signed)
 Pt discharged to home, instructions and medications reviewed.  Pt verbalized understanding, no questions at this time.

## 2024-10-03 ENCOUNTER — Ambulatory Visit: Payer: Self-pay | Admitting: Allergy & Immunology
